# Patient Record
Sex: Male | Born: 1965
Health system: Southern US, Community
[De-identification: ages and names within clinical notes are randomized; demographics above are authoritative.]

## PROBLEM LIST (undated history)

## (undated) DIAGNOSIS — J189 Pneumonia, unspecified organism: Secondary | ICD-10-CM

## (undated) DIAGNOSIS — L309 Dermatitis, unspecified: Secondary | ICD-10-CM

## (undated) DIAGNOSIS — N529 Male erectile dysfunction, unspecified: Secondary | ICD-10-CM

## (undated) HISTORY — DX: Pneumonia, unspecified organism: J18.9

## (undated) HISTORY — PX: KNEE SURGERY: SHX244

## (undated) HISTORY — DX: Dermatitis, unspecified: L30.9

## (undated) HISTORY — PX: TONSILLECTOMY: SUR1361

---
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
## 1999-06-26 ENCOUNTER — Emergency Department (HOSPITAL_COMMUNITY): Admission: EM | Admit: 1999-06-26 | Discharge: 1999-06-26 | Payer: Self-pay

## 2003-04-05 ENCOUNTER — Inpatient Hospital Stay (HOSPITAL_COMMUNITY): Admission: EM | Admit: 2003-04-05 | Discharge: 2003-04-07 | Payer: Self-pay | Admitting: Emergency Medicine

## 2004-10-28 ENCOUNTER — Emergency Department (HOSPITAL_COMMUNITY): Admission: EM | Admit: 2004-10-28 | Discharge: 2004-10-28 | Payer: Self-pay | Admitting: Emergency Medicine

## 2006-01-28 DIAGNOSIS — J189 Pneumonia, unspecified organism: Secondary | ICD-10-CM

## 2006-01-28 HISTORY — DX: Pneumonia, unspecified organism: J18.9

## 2007-12-22 ENCOUNTER — Emergency Department (HOSPITAL_COMMUNITY): Admission: EM | Admit: 2007-12-22 | Discharge: 2007-12-22 | Payer: Self-pay | Admitting: Emergency Medicine

## 2007-12-23 ENCOUNTER — Inpatient Hospital Stay (HOSPITAL_COMMUNITY): Admission: EM | Admit: 2007-12-23 | Discharge: 2007-12-26 | Payer: Self-pay | Admitting: Emergency Medicine

## 2009-01-17 ENCOUNTER — Emergency Department (HOSPITAL_COMMUNITY): Admission: EM | Admit: 2009-01-17 | Discharge: 2009-01-17 | Payer: Self-pay | Admitting: Family Medicine

## 2009-02-01 ENCOUNTER — Emergency Department (HOSPITAL_COMMUNITY): Admission: EM | Admit: 2009-02-01 | Discharge: 2009-02-01 | Payer: Self-pay | Admitting: Family Medicine

## 2009-02-08 ENCOUNTER — Emergency Department (HOSPITAL_COMMUNITY): Admission: EM | Admit: 2009-02-08 | Discharge: 2009-02-08 | Payer: Self-pay | Admitting: Family Medicine

## 2010-04-15 LAB — POCT URINALYSIS DIP (DEVICE)
Bilirubin Urine: NEGATIVE
Glucose, UA: NEGATIVE mg/dL
Ketones, ur: NEGATIVE mg/dL
Nitrite: NEGATIVE
Protein, ur: NEGATIVE mg/dL
Specific Gravity, Urine: 1.02 (ref 1.005–1.030)
Urobilinogen, UA: 0.2 mg/dL (ref 0.0–1.0)
pH: 6 (ref 5.0–8.0)

## 2010-04-30 LAB — POCT URINALYSIS DIP (DEVICE)
Glucose, UA: NEGATIVE mg/dL
Hgb urine dipstick: NEGATIVE
Specific Gravity, Urine: 1.02 (ref 1.005–1.030)
Urobilinogen, UA: 0.2 mg/dL (ref 0.0–1.0)

## 2010-04-30 LAB — URINE CULTURE
Colony Count: NO GROWTH
Culture: NO GROWTH

## 2010-04-30 LAB — GC/CHLAMYDIA PROBE AMP, GENITAL: Chlamydia, DNA Probe: NEGATIVE

## 2010-06-12 NOTE — Op Note (Signed)
Vincent Cook, FESTA             ACCOUNT NO.:  [id number]   MEDICAL RECORD NO.:  [id number]          PATIENT TYPE:  INP   LOCATION:  5024                         FACILITY:  [facility]   PHYSICIAN:  Dionne Ano. Gramig, M.D.DATE OF BIRTH:  1965-03-13   DATE OF PROCEDURE:  12/23/2007  DATE OF DISCHARGE:                               OPERATIVE REPORT   I had the pleasure to see Vincent Cook today in the [facility]  Emergency Room at 3:00 a.m. on December 23, 2007.  This patient was seen  just over 24 hours ago at Urgent Care.  He was placed on antibiotics in  the form of doxycycline and sent home with conservatory measures after a  chief complaint of right index finger pain and swelling.  He continues  to have worsening pain and swelling, which prompted another ER visit  tonight.  He has negative x-rays.  His hand is swollen, painful, and he  appears to have a deep abscess.  I was called by the emergency room  staff to evaluate this.   The patient and I have discussed all issues.  He denies any usual  exposure.  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   PAST MEDICAL HISTORY:  None significant.   PAST SURGICAL HISTORY:  Knee arthroscopy by Dr. Brynda Greathouse.   MEDICINES:  Doxycycline recently.   ALLERGIES:  None.   SOCIAL HISTORY:  He is a single man who is employed by himself in an oil  changing business.   On exam, he is alert and oriented in no acute distress.  Vital signs are  stable.  He has intact sensation to the hand swelling dorsally over the  hand up to the wrist region and area of deep abscess over the index  finger.  There are no signs of flexor tenosynovitis at this juncture as  his flexor apparatus is soft and nontender.  The abscess is dorsally in  location between the PIP and MCP joints, it appears to track proximally.  The middle ring and small fingers are intact.  There are no signs of  dystrophy or infection.  I have reviewed this at  length and his  findings.  The chest is clear.  Heart is regular rate.  Abdomen is  nontender.  Lower extremity examination is benign.   IMPRESSION:  Deep abscess, right upper extremity about the hand and  index finger.   PLAN:  I have consented him for surgical endeavors, and he desires to  proceed understanding the risks and benefits of surgery.   PROCEDURE NOTE:   PREOPERATIVE DIAGNOSIS:  Deep abscess, right index finger of hand.   POSTOPERATIVE DIAGNOSIS:  Deep abscess, right index finger of hand.   PROCEDURE:  Incision and drainage, deep abscess right index finger of  hand.   SURGEON:  Dionne Ano. Amanda Pea, MD   INDICATIONS FOR THE PROCEDURE:  Please see above.   OPERATIVE PROCEDURE:  The patient was seen and underwent a wrist block  anesthetic with lidocaine without epinephrine given by myself.  He was  then prepped and draped  with Betadine scrub and paint in the usual  sterile fashion without difficulty.  Once he underwent sterile prep and  drape, I then performed an incision.  Dissection was carried down deeply  and immediately I encountered a large abscess.  This was cultured for  aerobic and anaerobic cultures.  I then proceeded to debride an area of  significant purulence and devitalized prenecrotic skin tissues as well  as fat necrosis.  He tolerated this well.  There were no complicating  features.  Once this done, I packed the wound after copious lavage with  irrigant.  The wound was packed with Iodoform.   He will be admitted for IV antibiotics including Fortaz and vancomycin,  await cultures.  Continue aggressive wound care program with serial  packing and I and D.  I have consulted therapy to see him for daily week  dressings to the finger, and we will monitor his condition closely.  I  will consider a repeat I and D if necessary and he understands.  Fair  prognosis at this juncture.   It was pleasure to see him today and all questions have been encouraged   and answered.      Dionne Ano. Amanda Pea, M.D.  Electronically Signed     WMG/MEDQ  D:  12/23/2007  T:  12/23/2007  Job:  [id number]

## 2010-06-15 NOTE — H&P (Signed)
NAME:  Vincent Cook, Vincent Cook NO.:  [id number]   MEDICAL RECORD NO.:  [id number]                   PATIENT TYPE:  EMS   LOCATION:  ED                                   FACILITY:  [facility]   PHYSICIAN:  Isla Pence, M.D.             DATE OF BIRTH:  26-Dec-1965   DATE OF ADMISSION:  04/04/2003  DATE OF DISCHARGE:                                HISTORY & PHYSICAL   ID STATEMENT:  This is a 45 year old black gentleman with no prior medical  history who has no primary care physician.   CHIEF COMPLAINT:  Throwing up since Friday.   HISTORY OF PRESENT ILLNESS:  This patient states he has been throwing up  over the weekend.  He says he has been throwing up blood, getting some acid  reflux, and then he started getting some shortness of breath on April 04, 2003, which was the day he came to the ER in the evening.  He apparently has  been experiencing some fever which started apparently on Friday.  There was  no actual temperature taken, but he just felt fevered.  Apparently he has  also had some coughing spells with hiccups, and he has been coughing up some  yellow phlegm.  He has been drinking water to help with his reflux type of  symptoms.  He denies previous history of reflux.  The patient notes that the  coughing spells are the ones initiating these gagging, throwing-up spells.  The patient has been also experiencing some diarrhea around the same time,  which he notes has been watery.  He has been going about 10 times a day,  slowed down a little bit.  He is taking Pepto-Bismol for this.  His last BM  was about 3 hours ago, but it is still watery.  No one else in the family is  sick.  He apparently had gone to some kind of going-away party for a friend  of his with a few others.  They had all eaten the same cake.  No one else is  sick.  The patient has had decreased oral intake secondary to his current  illness and says that he has lost about 16 pounds.  In  addition to the Pepto-  Bismol for his diarrhea he has been taking Alka-Seltzer, also Tylenol Sinus  __________ and Zantac.  All of these have helped some.  The patient denies  any sore throat or rhinorrhea.   ALLERGIES:  No known drug allergies.   CURRENT MEDICATIONS:  Just as per HPI.  Otherwise, he is not on any chronic  medications.   PAST MEDICAL HISTORY:  Denies any history of asthma, diabetes mellitus,  hypertension, coronary artery disease, etc.   PAST SURGICAL HISTORY:  1. He has had left knee surgery consisting of an arthroscopy.  2. He had tonsillectomy in 1984.   SOCIAL HISTORY:  He has never married or  divorced.  He lives with his  current partner, who is the mother of at least 1 of his children.  He has 4  kids.  Not all of the kids are with the same partner.  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  He is sexually  active with his current partner.  Denies any HIV risk factors.  Denies any  tobacco use.  Alcohol use he states maybe 3-4 times a week, variable amount,  but says he drinks a 6-pack each time during the football season.  On  further questioning, he is a little vague; says he does not drink much.   FAMILY HISTORY:  Denies coronary artery disease, diabetes mellitus, stroke,  cancers of any kind, hypertension.  Essentially denies all diseases.  Says  there are 5 generations of his family still alive.   REVIEW OF SYSTEMS:  As per HPI, but he says he does have a history of  snoring, and according to him the girlfriend has told him that he has had  apneic spells.  The rest is as per HPI.  He denies any bright red blood per  rectum or melena.   PHYSICAL EXAM:  VITAL SIGNS:  Temperature in the ER was 103, respiratory  rate 18, blood pressure 112/73, room air saturations were 99%.  GENERAL:  In no apparent distress.  HEENT:  The oral mucosa is currently moist.  He had received about 1 L of  normal saline in the ER.  He  had also received Phenergan, Zofran, potassium  orally and by IV.  He had also received 1 g of Rocephin and Zithromax 500 in  the ER.  He was also given Tylenol rectally for his fever.  LUNGS:  He has decreased breath sounds at the left base.  There is no  egophony.  HEART:  Regular rate and rhythm.  ABDOMEN:  Bowel sounds are normal, soft, nontender, not distended.  LOWER EXTREMITIES:  There is no edema.  NEUROLOGIC:  There are no focal deficits.   LABORATORY WORKUP:  Initial white count:  CBC showed a white count of 11.8  thousand, H&H of 14.7 and 42.3, platelet count of 190,000.  Differential  showed 86% PMNs, lymphocytes of 8.  BMP showed a sodium of 131, potassium of  2.9, chloride and CO2 were 95 and 29 respectively, BUN and creatinine 12 and  1.4, glucose of 141.  LFTs were normal except for a slight elevation in  total bilirubin, albumin of 3.3 which is minimally decreased, calcium was  normal.  His lipase was slightly elevated at 98.  PT was normal, PTT,  however, was slightly elevated at 43.  His UA showed a protein of greater  than 300, small leukocytes, macros were 0-2, small blood, micros were  negative.   Chest x-ray:  Clearly you can see a left-sided infiltrative-type of process.  The radiologist apparently had other concerns with possible hemorrhage,  possible mass.  Therefore, a CT scan was done which came out showing  consolidation.  This just needs to be followed up.   ASSESSMENT AND PLAN:  1. Left lobar pneumonia.  Will continue him on Rocephin and Zithromax for     community-acquired pneumonia.  He had blood cultures drawn in the ER     which need to be followed up.  Will do albuterol nebulizers q.i.d. to     help loosen up the secretions, Humibid L.A. also, and need to follow up  on his official CT scan report and see what else is mentioned on the CT     to make sure we did not need to do any anything further. 2. Decrease in potassium secondary to decreased  oral intake and diarrhea.     Will treat this.  3. Decrease in sodium secondary once again to the decreased oral intake with     dehydration with his diarrhea.  Will go ahead and continue him on IV     fluids.  4. Diarrhea.  Will do supportive treatment with Imodium A-D, and I am     suspecting lipase is slightly elevated secondary to this and him having     some nausea and vomiting, so this just needs to be repeated, which I have     ordered already.  5. Question of increase in his PTT.  This may be just transient due to his     current illness; therefore, will recheck it in the a.m. and see.  6. Question of sleep disorder.  This needs to be further worked up.  I do     not think it is a good thing to work him up currently with his     significant pneumonia but as an outpatient and, thus, I have advised him     that considering his age it probably is a good idea for him to get a     primary care physician and once the pneumonia settles to evaluate the     possibility of possible sleep disorder.  7. For deep vein thrombosis prophylaxis I have gone ahead and put him on     Lovenox since I am not very sure he will be getting out of bed as much     with him feeling not too good.  8. With some of his marked gastrointestinal irritation, dyspepsia kind of     symptoms, will give him Protonix.                                               Isla Pence, M.D.    RRV/MEDQ  D:  04/05/2003  T:  04/05/2003  Job:  [id number]

## 2010-06-15 NOTE — Discharge Summary (Signed)
Vincent Cook, Vincent Cook                         ACCOUNT NO.:  [id number]   MEDICAL RECORD NO.:  [id number]                   PATIENT TYPE:  INP   LOCATION:  0450                                 FACILITY:  [facility]   PHYSICIAN:  Jackie Plum, M.D.             DATE OF BIRTH:  [birth year]   DATE OF ADMISSION:  04/04/2003  DATE OF DISCHARGE:                                 DISCHARGE SUMMARY   DISCHARGE DIAGNOSES:  1. Pneumonia.  2. Hypokalemia, resolved.  3. Diarrhea, resolved.  4. Questionable sleep disorder/sleep apnea; outpatient follow-up     recommended.  5. Mild normocytic anemia; outpatient follow-up recommended.  6. Reflux esophagitis.   DISCHARGE MEDICATIONS:  1. Protonix 40 mg daily.  2. Humibid LA 600 mg b.i.d.  3. Ceftin 500 mg b.i.d. for 10 days.  4. Zithromax 250 mg daily for 2 more days.  5. [employer] 240 mL b.i.d.  6. Ambien 5 mg p.o. q.h.s. p.r.n.   ACTIVITY:  As tolerated.   DIET:  Diet to be no restrictions.   The patient to report to M.D. if he experiences any problems including but  not limited to fever, chills, shortness of breath.  He is definitely to see  a doctor for repeat CT scan of the chest.  This has been emphasized to the  patient.  Follow up will be with [name] where the patient is  to be seen by a [name] in 10-14 days.   DISCHARGE LABORATORY DATA:  Wbc count 8.3, hemoglobin 12.1, hematocrit 35.6,  MCV 91.7, platelet count 224.  Sodium 135, potassium 3.9, chloride 111, CO2  22, glucose 176, BUN 9, creatinine 1.0, calcium 7.6, albumin 2.6.   CONSULTS:  Not applicable.   PROCEDURES:  Not applicable.   CONDITION:  Discharged improved and satisfactory.   REASON FOR HOSPITALIZATION:  Community-acquired pneumonia.   Please see the admission H&P by Dr. Frederico Hamman dated April 04, 2003.  The  patient presented with fever, cough productive of yellowish sputum,  heartburn, diarrhea, and some questionable  hematemesis.  No history of  hemoptysis.  According to the admission H&P by Dr. Baldo Daub the patient's  temperature was 103 degrees Fahrenheit.  His lung exam was notable for  decreased breath sounds at the left base.  Cardiac exam was unremarkable.  Abdomen was soft, nontender.  He was alert and oriented x3.  Lab work  notable for leukocytosis of 11,800 with hemoglobin of 14.7; hematocrit of  32.3; MCV of 90.2; platelet count of 190.  He had a hyponatremia of 131 with  hypokalemia of 2.9.  His liver function tests were unremarkable.  He had an  elevated, mildly, lipase which was thought to be due to his GI symptoms  without any overt acute pancreatitis clinically.  The patient had a CT done  of chest which showed dense consolidation of the lingula and minimal patchy  density within the left lower  lobe.  He was therefore admitted for  pneumonia, community acquired.   HOSPITAL COURSE:  The patient was admitted to the hospitalist service  whereupon he was started on IV Rocephin and Zithromax.  Blood cultures were  drawn but did not reveal any growth at time of discharge.  Pulmonary  toileting measures including nebulizer and expectorants were also  instituted.  Other supplementary supportive measures including IV fluids  with antiemetics were also instituted.  His potassium was appropriately  repleted with correction of his sodium.  His diarrhea was also treated with  some Imodium, which had resolved at the time of discharge.  The patient was  subsequently put on DVT prophylaxis and later on discontinued because he had  been feeling well and had started to be ambulatory.  He received Protonix  for his GI symptoms.  It is believed that his history of hematemesis which  is described as a few streaks of blood in his vomitus is related to his  reflex esophagitis.  There was no significant acute blood loss while the  patient was in the hospital.  He is going to be continued on PPI at this  time.   This morning on rounds the patient is feeling well, does not have any  fever or chills, denies any persistent cough, denies any shortness of  breath, has been ambulatory on the floor without any problems.  His O2  saturation on room air at 10 o'clock was 98%.  His vital signs are stable,  his blood pressure was 108/62, pulse of 79, respiratory rate of 18,  temperature of 97.9 degrees Fahrenheit.  He is not clinically dehydrated on  oropharynx exam.He has mild conjunctival pallor.  Lung exam is notable for  mildly-decreased vesicular breath sounds at his left base without any  obvious wheezes; in fact, his breath sounds are improved.  Cardiac exam  notable for regular rate and rhythm without any gallops.  Bowel sounds are  soft and does not have an tenderness on abdominal exam.  Extremity exam is  without any edema.  He is alert and oriented x3, no acute focal deficits.  The patient is therefore deemed appropriate for discharge with outpatient  follow-up.  He will need a repeat CAT scan in about 2 months for follow-up  of his dense consolidation.   The patient does not have a primary care physician and I spent more than 30  minutes preparing him for discharge today.  I really counseled him regarding  need for follow-up with a [name] to review repeat imaging study of his  chest with respect to abnormality on CT scan which is deemed presumptively  due to pneumonia for clearance.  He expressed understanding, his questions  were appropriately answered, and he is satisfied and understands  satisfactorily.                                               Jackie Plum, M.D.    GO/MEDQ  D:  04/07/2003  T:  04/07/2003  Job:  [id number]   cc:   [name]

## 2010-06-15 NOTE — Discharge Summary (Signed)
NAMEBUEL, MOLDER             ACCOUNT NO.:  [id number]   MEDICAL RECORD NO.:  [id number]          PATIENT TYPE:  INP   LOCATION:  5024                         FACILITY:  [facility]   PHYSICIAN:  Dionne Ano. Gramig, M.D.DATE OF BIRTH:  27-Aug-1965   DATE OF ADMISSION:  12/23/2007  DATE OF DISCHARGE:  12/26/2007                               DISCHARGE SUMMARY   Vincent Cook was admitted at 4 a.m. on December 23, 2007, after he  presented with a deep abscess.  He underwent I and D of the deep  abscess.  The patient was noted to have culture-positive MRSA.  He was  placed initially on vancomycin and Zosyn.  I discontinued the Zosyn as  his culture grew out MRSA.  The patient continued in the hospital from  his admission date, December 23, 2007, through December 26, 2007.  During that time, he received multiple care/hydrotherapy, aggressive  wound debridement and changes, and ultimately did quite well.  At the  time of his date of discharge, December 26, 2007, the patient had stable  vitals.  His heart was regular rate.  The lungs were clear.  Abdomen was  nontender.  He was tolerating a regular diet and his wound was much  improved.  We will continue outpatient measures and serial debridements  and wound care at my office.  He was stable for discharge on December 26, 2007, and was discharged on doxycycline 100 mg b.i.d., Bactrim 1-2  q.4-6 h. p.r.n. pain, p.o. vitamin C 1000 mg a day, and Peri-Colace 1  p.o. b.i.d.  He will return to the office to see me in 24-48 hours,  notify me should any problems and we will continue to keep a very close  eye on this gentleman.  It has been a pleasure seeing and treating him,  and hope that we will eradicate his infection swiftly.  He understands  the implication of community-acquired MRSA infections etc., as explained  to him.   FINAL DIAGNOSIS:  Deep abscess with culture-positive methicillin-  resistant Staphylococcus aureus.      Dionne Ano.  Amanda Pea, M.D.  Electronically Signed     WMG/MEDQ  D:  03/10/2008  T:  03/10/2008  Job:  [id number]

## 2010-10-05 ENCOUNTER — Inpatient Hospital Stay (INDEPENDENT_AMBULATORY_CARE_PROVIDER_SITE_OTHER)
Admission: RE | Admit: 2010-10-05 | Discharge: 2010-10-05 | Disposition: A | Discharge status: Home / Self Care | Payer: Self-pay | Source: Ambulatory Visit | Attending: Emergency Medicine | Admitting: Emergency Medicine

## 2010-10-05 DIAGNOSIS — L0213 Carbuncle of neck: Secondary | ICD-10-CM

## 2010-10-30 LAB — WOUND CULTURE

## 2010-10-30 LAB — DIFFERENTIAL
Eosinophils Absolute: 0.2
Eosinophils Relative: 1
Lymphocytes Relative: 10 — ABNORMAL LOW
Lymphs Abs: 1.3
Monocytes Absolute: 0.7
Monocytes Relative: 5

## 2010-10-30 LAB — CULTURE, ROUTINE-ABSCESS

## 2010-10-30 LAB — BASIC METABOLIC PANEL
BUN: 9
Chloride: 101
GFR calc Af Amer: >60
Potassium: 3.9
Sodium: 132 — ABNORMAL LOW

## 2010-10-30 LAB — CBC
HCT: 46.2
Hemoglobin: 15.7
MCV: 96.4
RBC: 4.8
WBC: 12.9 — ABNORMAL HIGH

## 2010-10-30 LAB — ANAEROBIC CULTURE

## 2012-08-18 ENCOUNTER — Encounter (HOSPITAL_COMMUNITY): Payer: Self-pay | Admitting: Emergency Medicine

## 2012-08-18 ENCOUNTER — Emergency Department (INDEPENDENT_AMBULATORY_CARE_PROVIDER_SITE_OTHER)
Admission: EM | Admit: 2012-08-18 | Discharge: 2012-08-18 | Disposition: A | Discharge status: Home / Self Care | Payer: Self-pay | Source: Home / Self Care | Attending: Family Medicine | Admitting: Family Medicine

## 2012-08-18 DIAGNOSIS — L02219 Cutaneous abscess of trunk, unspecified: Secondary | ICD-10-CM

## 2012-08-18 DIAGNOSIS — L02213 Cutaneous abscess of chest wall: Secondary | ICD-10-CM

## 2012-08-18 MED ORDER — DOXYCYCLINE HYCLATE 100 MG PO CAPS: 100.0000 mg | ORAL_CAPSULE | Freq: Two times a day (BID) | ORAL | Status: DC

## 2012-08-18 NOTE — ED Notes (Signed)
Triaged/discharged patient for [employer], rn

## 2012-08-18 NOTE — Discharge Instructions (Signed)
Warm compress twice a day when you take the antibiotic, take all of medicine, return as needed. °

## 2012-08-18 NOTE — ED Provider Notes (Signed)
   History    CSN: [id number] Arrival date & time 08/18/12  1651  First MD Initiated Contact with Patient 08/18/12 1719     No chief complaint on file.  (Consider location/radiation/quality/duration/timing/severity/associated sxs/prior Treatment) Patient is a 47 y.o. male presenting with abscess. The history is provided by the patient.  Abscess Location:  Torso Torso abscess location:  R chest Abscess quality: fluctuance, painful and redness   Red streaking: no   Duration:  5 days Progression:  Worsening Pain details:    Severity:  Mild Risk factors: hx of MRSA    No past medical history on file. No past surgical history on file. No family history on file. History  Substance Use Topics  . Smoking status: Not on file  . Smokeless tobacco: Not on file  . Alcohol Use: Not on file    Review of Systems  Constitutional: Negative.   Skin: Positive for rash.    Allergies  Review of patient's allergies indicates not on file.  Home Medications   Current Outpatient Rx  Name  Route  Sig  Dispense  Refill  . doxycycline (VIBRAMYCIN) 100 MG capsule   Oral   Take 1 capsule (100 mg total) by mouth 2 (two) times daily.   20 capsule   0    BP 127/87  Pulse 68  Temp(Src) 98.3 F (36.8 C) (Oral)  Resp 16  SpO2 100% Physical Exam  Nursing note and vitals reviewed. Constitutional: He is oriented to person, place, and time. He appears well-developed and well-nourished. No distress.  Neurological: He is alert and oriented to person, place, and time.  Skin: Skin is warm and dry. There is erythema.  2cm fluctuant lesion on right upper chest, tender    ED Course  INCISION AND DRAINAGE Date/Time: 08/18/2012 5:33 PM Performed by: Linna Hoff Authorized by: Bradd Canary D Consent: Verbal consent obtained. Risks and benefits: risks, benefits and alternatives were discussed Consent given by: patient Type: abscess Body area: trunk Location details: chest Local anesthetic:  topical anesthetic Patient sedated: no Scalpel size: 11 Incision type: single straight Complexity: simple Drainage: purulent Drainage amount: moderate Wound treatment: wound left open Patient tolerance: Patient tolerated the procedure well with no immediate complications. Comments: Culture obtained   (including critical care time) Labs Reviewed - No data to display No results found. 1. Chest wall abscess     MDM    Linna Hoff, MD 08/18/12 1736

## 2012-08-18 NOTE — ED Notes (Signed)
Reports bite to right side of chest.  Reports this has been present for 5 days.

## 2012-08-19 ENCOUNTER — Telehealth (HOSPITAL_COMMUNITY): Payer: Self-pay | Admitting: *Deleted

## 2012-08-19 NOTE — ED Notes (Signed)
Pt. called for a cheaper Rx. States Doxycycline is $53.00.  Discussed with Dr. Artis Flock. and he said to change it to Septra DS 1 po BID x 10 days.  I told pt. I would call it now.  Rx. called to pharmacist @ [facility] at [facility] @ [phone].

## 2012-08-22 LAB — CULTURE, ROUTINE-ABSCESS

## 2012-08-24 NOTE — ED Notes (Addendum)
Abscess culture: Rare staph species (coagulase neg.).  Pt. adequately treated with I and D and Septra DS.   Vassie Moselle 08/24/2012

## 2012-08-26 NOTE — ED Notes (Signed)
Returned call from pt about his medication for a new abscess.  Per Dr Ladon Applebaum, if pt noticed another abscess he needs to be seen again.  Message left on asn mach

## 2013-05-31 ENCOUNTER — Encounter (HOSPITAL_COMMUNITY): Payer: Self-pay | Admitting: Emergency Medicine

## 2013-05-31 ENCOUNTER — Emergency Department (HOSPITAL_COMMUNITY)
Admission: EM | Admit: 2013-05-31 | Discharge: 2013-05-31 | Disposition: A | Discharge status: Home / Self Care | Payer: BC Managed Care – PPO | Attending: Emergency Medicine | Admitting: Emergency Medicine

## 2013-05-31 DIAGNOSIS — G8929 Other chronic pain: Secondary | ICD-10-CM | POA: Insufficient documentation

## 2013-05-31 DIAGNOSIS — M25562 Pain in left knee: Secondary | ICD-10-CM

## 2013-05-31 DIAGNOSIS — M25569 Pain in unspecified knee: Secondary | ICD-10-CM | POA: Insufficient documentation

## 2013-05-31 DIAGNOSIS — Z792 Long term (current) use of antibiotics: Secondary | ICD-10-CM | POA: Insufficient documentation

## 2013-05-31 DIAGNOSIS — H579 Unspecified disorder of eye and adnexa: Secondary | ICD-10-CM | POA: Insufficient documentation

## 2013-05-31 DIAGNOSIS — M25469 Effusion, unspecified knee: Secondary | ICD-10-CM | POA: Insufficient documentation

## 2013-05-31 DIAGNOSIS — Z9889 Other specified postprocedural states: Secondary | ICD-10-CM | POA: Insufficient documentation

## 2013-05-31 DIAGNOSIS — H5789 Other specified disorders of eye and adnexa: Secondary | ICD-10-CM

## 2013-05-31 MED ORDER — HYDROCODONE-ACETAMINOPHEN 5-325 MG PO TABS: 1.0000 | ORAL_TABLET | ORAL | Status: DC | PRN

## 2013-05-31 MED ORDER — KETOTIFEN FUMARATE 0.025 % OP SOLN: 1.0000 [drp] | Freq: Two times a day (BID) | OPHTHALMIC | Status: DC

## 2013-05-31 MED ORDER — MELOXICAM 7.5 MG PO TABS: 7.5000 mg | ORAL_TABLET | Freq: Every day | ORAL | Status: DC

## 2013-05-31 NOTE — ED Notes (Signed)
Pt reports knee pain and swelling, hx of surgery to knees, denies injury.

## 2013-05-31 NOTE — ED Notes (Signed)
PA at bedside.

## 2013-05-31 NOTE — ED Provider Notes (Signed)
Medical screening examination/treatment/procedure(s) were performed by non-physician practitioner and as supervising physician I was immediately available for consultation/collaboration.   EKG Interpretation None        Wilkin Lippy Y. Chayce Robbins, MD 05/31/13 1619 

## 2013-05-31 NOTE — Discharge Instructions (Signed)
Read the information below.  Use the prescribed medication as directed.  Please discuss all new medications with your pharmacist.  Do not take additional tylenol while taking the prescribed pain medication to avoid overdose.  You may return to the Emergency Department at any time for worsening condition or any new symptoms that concern you.  If you develop uncontrolled pain, weakness or numbness of the extremity, severe discoloration of the skin, or you are unable to walk, return to the ER for a recheck.   If you develop eye pain, increased discharge from your eye, change in vision, return to the ER immediately for a recheck.    Knee Pain Knee pain can be a result of an injury or other medical conditions. Treatment will depend on the cause of your pain. HOME CARE  Only take medicine as told by your doctor.  Keep a healthy weight. Being overweight can make the knee hurt more.  Stretch before exercising or playing sports.  If there is constant knee pain, change the way you exercise. Ask your doctor for advice.  Make sure shoes fit well. Choose the right shoe for the sport or activity.  Protect your knees. Wear kneepads if needed.  Rest when you are tired. GET HELP RIGHT AWAY IF:   Your knee pain does not stop.  Your knee pain does not get better.  Your knee joint feels hot to the touch.  You have a fever. MAKE SURE YOU:   Understand these instructions.  Will watch this condition.  Will get help right away if you are not doing well or get worse. Document Released: 04/12/2008 Document Revised: 04/08/2011 Document Reviewed: 04/12/2008 [facility] Patient Information 2014 [place], [place].

## 2013-05-31 NOTE — ED Provider Notes (Signed)
CSN: [id number]     Arrival date & time 05/31/13  1123 History  This chart was scribed for non-physician practitioner working with No att. providers found, by Jarvis Morganaylor Ferguson, ED Scribe. This patient was seen in room TR04C/TR04C and the patient's care was started at 3:45PM.    Chief Complaint  Patient presents with  . Joint Swelling  . Knee Pain    The history is provided by the patient. No language interpreter was used.   HPI Comments: Vincent Cook is a 48 y.o. male who presents to the Emergency Department complaining of gradually worsening, aching, 6/10, left knee pain onset 3 days ago. Patient notes that the pain got worse yesterday. Patient denies any injury. Patient has a history of arthroscopic surgery of his left knee due to a torn ligament many years ago. Patient reports that his knee pain will flare up about 2x a year. Patient states that he has associated swelling. He reports that he has mild relief with rest. Patient states that he has not taken anything for the pain. States he walked more than usual this week and helped someone pick up a tv, but denies injury related to this.  Patient denies any fever, recent illness, weakness or numbness in leg. No injury.    Patient has a secondary complaint of a yellow spot in his left eye onset two weeks ago. Patient also states that he has associated clear drainage and mild redness. Patient states that he has environmental allergies. Patient denies any excessive sun exposure.  Denies exposure to sick contacts.  No recent illness.  No FB or FB sensation.  No trauma to eye.      History reviewed. No pertinent past medical history. Past Surgical History  Procedure Laterality Date  . Knee surgery     History reviewed. No pertinent family history. History  Substance Use Topics  . Smoking status: Never Smoker   . Smokeless tobacco: Not on file  . Alcohol Use: Yes    Review of Systems  Constitutional: Negative for fever.  Eyes: Positive  for discharge ("watery") and redness.  Musculoskeletal: Positive for joint swelling (left knee pain).  Neurological: Negative for weakness and numbness.  All other systems reviewed and are negative.     Allergies  Review of patient's allergies indicates no known allergies.  Home Medications   Prior to Admission medications   Medication Sig Start Date End Date Taking? Authorizing Provider  doxycycline (VIBRAMYCIN) 100 MG capsule Take 1 capsule (100 mg total) by mouth 2 (two) times daily. 08/18/12   Linna HoffJames D Kindl, MD   Triage Vitals: BP 124/72  Pulse 66  Temp(Src) 97.6 F (36.4 C) (Oral)  Resp 19  Ht 5\' 11"  (1.803 m)  Wt 196 lb 9 oz (89.16 kg)  BMI 27.43 kg/m2  SpO2 99%  Physical Exam  Nursing note and vitals reviewed. Constitutional: He appears well-developed and well-nourished. No distress.  HENT:  Head: Normocephalic and atraumatic.  Eyes: EOM and lids are normal. Pupils are equal, round, and reactive to light. Right eye exhibits no discharge. Left eye exhibits no discharge. No scleral icterus.    Very mild injection of left eye compared to right.  No periorbital or lid edema, erythema, warmth, or tenderness.    Neck: Neck supple.  Pulmonary/Chest: Effort normal.  Musculoskeletal:       Right knee: Normal.       Left knee: He exhibits normal range of motion, no ecchymosis, no deformity, no erythema, normal alignment, no  LCL laxity, no bony tenderness and no MCL laxity. No tenderness found.       Legs: Gait is normal.  Negative thessaly test.   Calves nontender, no edema, erythema, or warmth.  Distal pulses intact.    Neurological: He is alert.  Skin: He is not diaphoretic.    ED Course  Procedures (including critical care time)  DIAGNOSTIC STUDIES: Oxygen Saturation is 99% on RA, normal by my interpretation.    COORDINATION OF CARE: 3:53 PM- Will discharge patient with medication. Also advised patient to consult with an orthopedic specialist.    DIAGNOSTIC  STUDIES: Oxygen Saturation is 99% on RA, normal by my interpretation.    COORDINATION OF CARE:    Labs Review Labs Reviewed - No data to display  Imaging Review No results found.   EKG Interpretation None      MDM   Final diagnoses:  Left knee pain  Irritation of left eye    Pt with flare up of chronic left knee pain without injury.  No s/s concerning for septic joint.  No injury.  Pt bears weight.  Pt placed in knee sleeve, d/c home with pain medication, ortho follow up.  Pt has had two weeks of left eye symptoms without injury or FB.  Watery discharge only.  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  Only mild injection.  Denies possibility of FB.  Does not have purulent discharge.  No URI symptoms.  Likely mild allergic conjunctivitis.  D/C home with zaditor eyedrops and ophthlamology referral, also norco, mobic with orthopedic referral.  Discussed findings, treatment, and follow up  with patient.  Pt given return precautions.  Pt verbalizes understanding and agrees with plan.      I personally performed the services described in this documentation, which was scribed in my presence. The recorded information has been reviewed and is accurate.     Trixie Dredgemily Ailie Gage, PA-C 05/31/13 1616

## 2013-07-01 ENCOUNTER — Encounter: Payer: Self-pay | Admitting: Medical

## 2013-07-01 ENCOUNTER — Ambulatory Visit (INDEPENDENT_AMBULATORY_CARE_PROVIDER_SITE_OTHER): Payer: BC Managed Care – PPO | Admitting: Medical

## 2013-07-01 VITALS — BP 100/70 | HR 80 | Temp 98.0°F | Resp 14 | Ht 71.0 in | Wt 198.0 lb

## 2013-07-01 DIAGNOSIS — R358 Other polyuria: Secondary | ICD-10-CM

## 2013-07-01 DIAGNOSIS — F17200 Nicotine dependence, unspecified, uncomplicated: Secondary | ICD-10-CM

## 2013-07-01 DIAGNOSIS — H538 Other visual disturbances: Secondary | ICD-10-CM

## 2013-07-01 DIAGNOSIS — Z23 Encounter for immunization: Secondary | ICD-10-CM

## 2013-07-01 DIAGNOSIS — R3589 Other polyuria: Secondary | ICD-10-CM

## 2013-07-01 DIAGNOSIS — R37 Sexual dysfunction, unspecified: Secondary | ICD-10-CM

## 2013-07-01 DIAGNOSIS — F529 Unspecified sexual dysfunction not due to a substance or known physiological condition: Secondary | ICD-10-CM

## 2013-07-01 DIAGNOSIS — Z125 Encounter for screening for malignant neoplasm of prostate: Secondary | ICD-10-CM

## 2013-07-01 DIAGNOSIS — R6882 Decreased libido: Secondary | ICD-10-CM

## 2013-07-01 DIAGNOSIS — Z0000 Encounter for general adult medical examination without abnormal findings: Secondary | ICD-10-CM

## 2013-07-01 DIAGNOSIS — Z113 Encounter for screening for infections with a predominantly sexual mode of transmission: Secondary | ICD-10-CM

## 2013-07-01 LAB — POCT URINALYSIS DIPSTICK
BILIRUBIN UA: NEGATIVE
Blood, UA: NEGATIVE
Glucose, UA: NEGATIVE
KETONES UA: NEGATIVE
LEUKOCYTES UA: NEGATIVE
Nitrite, UA: NEGATIVE
PH UA: 5
Spec Grav, UA: 1.015
Urobilinogen, UA: NEGATIVE

## 2013-07-01 LAB — CBC WITH DIFFERENTIAL/PLATELET
Basophils Absolute: 0.1 10*3/uL (ref 0.0–0.1)
Basophils Relative: 1 % (ref 0–1)
Eosinophils Absolute: 0.2 10*3/uL (ref 0.0–0.7)
Eosinophils Relative: 4 % (ref 0–5)
HEMATOCRIT: 46.3 % (ref 39.0–52.0)
HEMOGLOBIN: 15.7 g/dL (ref 13.0–17.0)
LYMPHS PCT: 34 % (ref 12–46)
Lymphs Abs: 1.8 10*3/uL (ref 0.7–4.0)
MCH: 31.7 pg (ref 26.0–34.0)
MCHC: 33.9 g/dL (ref 30.0–36.0)
MCV: 93.5 fL (ref 78.0–100.0)
MONO ABS: 0.4 10*3/uL (ref 0.1–1.0)
MONOS PCT: 8 % (ref 3–12)
NEUTROS ABS: 2.8 10*3/uL (ref 1.7–7.7)
Neutrophils Relative %: 53 % (ref 43–77)
Platelets: 207 10*3/uL (ref 150–400)
RBC: 4.95 MIL/uL (ref 4.22–5.81)
RDW: 12.6 % (ref 11.5–15.5)
WBC: 5.2 10*3/uL (ref 4.0–10.5)

## 2013-07-01 LAB — TSH: TSH: 1.203 u[IU]/mL (ref 0.350–4.500)

## 2013-07-01 LAB — COMPREHENSIVE METABOLIC PANEL
ALT: 22 U/L (ref 0–53)
AST: 17 U/L (ref 0–37)
Albumin: 3.9 g/dL (ref 3.5–5.2)
Alkaline Phosphatase: 45 U/L (ref 39–117)
BUN: 13 mg/dL (ref 6–23)
CO2: 23 mEq/L (ref 19–32)
Calcium: 8.9 mg/dL (ref 8.4–10.5)
Chloride: 107 mEq/L (ref 96–112)
Creat: 0.99 mg/dL (ref 0.50–1.35)
Glucose, Bld: 102 mg/dL — ABNORMAL HIGH (ref 70–99)
Potassium: 4.1 mEq/L (ref 3.5–5.3)
Sodium: 137 mEq/L (ref 135–145)
Total Bilirubin: 0.7 mg/dL (ref 0.2–1.2)
Total Protein: 6.7 g/dL (ref 6.0–8.3)

## 2013-07-01 LAB — LIPID PANEL
Cholesterol: 156 mg/dL (ref 0–200)
HDL: 53 mg/dL (ref >39)
LDL Cholesterol: 77 mg/dL (ref 0–99)
Total CHOL/HDL Ratio: 2.9 Ratio
Triglycerides: 130 mg/dL (ref <150)
VLDL: 26 mg/dL (ref 0–40)

## 2013-07-01 NOTE — Patient Instructions (Addendum)
  Thank you for giving me the opportunity to serve you today.    Your diagnosis today includes: Encounter Diagnoses  Name Primary?  . Routine general medical examination at a health care facility Yes  . Sexual dysfunction   . Libido, decreased   . Blurred vision   . Polyuria   . Screen for STD (sexually transmitted disease)   . Screening for prostate cancer   . Need for Tdap vaccination   . Need for prophylactic vaccination against Streptococcus pneumoniae (pneumococcus)   . Tobacco use disorder      Specific recommendations today include:  I recommend you see and eye doctor and dentist yearly for routine care  I recommend you eat a healthy diet  I recommend you get exercise daily  I recommend flu shot in September each year  We updated your Tdap (tetanus, diptheria, and pertussis vaccine) today  We update your Pneumococcal vaccine 23 today  I recommend you consider stopping tobacco completely  We will call with lab results   Return pending labs.

## 2013-07-01 NOTE — Addendum Note (Signed)
Addended by: Janeice Robinson on: 07/01/2013 09:48 AM   Modules accepted: Orders

## 2013-07-01 NOTE — Progress Notes (Signed)
Subjective:   HPI  Vincent Cook is a 48 y.o. male who presents for a complete physical.  New patient today.  Accompanied by girlfriend.    Preventative care: Last ophthalmology visit:n/a Last dental visit:n/a Last colonoscopy:n/a Last prostate exam: ? Last EKG:n/a Last labs:new  Prior vaccinations: TD or Tdap:over 15 years ago Influenza:never Pneumococcal:n/a Shingles/Zostavax:n/a  Advanced directive:n/a Health care power of attorney:n/a Living will:n/a  Concerns: Several concerns that he discussed with girflreidn out of the room.    He reports problems with sex drive x 5-63 months.  In the past year has had problems with panic feeling, anxiety.  Started smoking due to this.  He wonders about low testosterone.  Erections are like they were 18 mo ago.   Seldom gets morning erections.  Been with girlfriend x 3 years.  Has trouble getting and keeping erections.    He wants prostate screening given uncle's hx/o prostate cancer.   Has blurred vision and increased urination, wants screening for diabets.  Wants STD screening.    He reports chronic pain with left knee, 2 x/ mo has really bad days.    Reviewed their medical, surgical, family, social, medication, and allergy history and updated chart as appropriate.  Past Medical History  Diagnosis Date  . Pneumonia 2008    [facility], 3 day hospitalization    Past Surgical History  Procedure Laterality Date  . Knee surgery      left, ligament repair, Dr. Telford Nab  . Tonsillectomy      History   Social History  . Marital Status: Single    Spouse Name: N/A    Number of Children: N/A  . Years of Education: N/A   Occupational History  . Not on file.   Social History Main Topics  . Smoking status: Current Every Day Smoker -- 0.50 packs/day for 1 years  . Smokeless tobacco: Not on file  . Alcohol Use: 7.2 oz/week    12 Cans of beer per week  . Drug Use: No  . Sexual Activity: Not on file   Other Topics  Concern  . 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 Diabetes Mother   . Diabetes Brother   . Cancer Maternal Uncle     prostate  . Heart disease Neg Hx   . Hypertension Neg Hx   . Stroke Neg Hx     No current outpatient prescriptions on file.  No Known Allergies     Review of Systems Constitutional: -fever, -chills, -sweats, -unexpected weight change, -decreased appetite, -fatigue Allergy: -sneezing, -itching, -congestion Dermatology: +changing moles, --rash, -lumps ENT: -runny nose, -ear pain, -sore throat, -hoarseness, -sinus pain, -teeth pain, - ringing in ears, -hearing loss, -nosebleeds Cardiology: -chest pain, -palpitations, -swelling, -difficulty breathing when lying flat, -waking up short of breath Respiratory: -cough, -shortness of breath, -difficulty breathing with exercise or exertion, -wheezing, -coughing up blood Gastroenterology: -abdominal pain, -nausea, -vomiting, -diarrhea, -constipation, -blood in stool, -changes in bowel movement, -difficulty swallowing or eating Hematology: -bleeding, -bruising  Musculoskeletal: -joint aches, -muscle aches, +joint swelling, -back pain, -neck pain, -cramping, -changes in gait Ophthalmology: denies vision changes, eye redness, itching, discharge Urology: -burning with urination, -difficulty urinating, -blood in urine, -urinary frequency, -urgency, -incontinence Neurology: -headache, -weakness, -tingling, -numbness, -memory loss, -falls, -dizziness Psychology: -depressed mood, -agitation, +sleep problems  Objective:   Physical Exam  BP 100/70  Pulse 80  Temp(Src) 98 F (36.7 C) (Oral)  Resp 14  Ht '5\' 11"'  (1.803 m)  Wt 198 lb (89.812 kg)  BMI 27.63 kg/m2  General appearance: alert, no distress, WD/WN, AA male Skin:few scatterd macules, no worrisome lesions HEENT:  normocephalic, conjunctiva/corneas normal, sclerae anicteric, PERRLA, EOMi, nares patent, no discharge or erythema, pharynx normal Oral cavity: MMM, tongue normal, teeth with moderate stain Neck: supple, no lymphadenopathy, no thyromegaly, no masses, normal ROM, no bruits Chest: non tender, normal shape and expansion Heart: RRR, normal S1, S2, no murmurs Lungs: CTA bilaterally, no wheezes, rhonchi, or rales Abdomen: +bs, soft, non tender, non distended, no masses, no hepatomegaly, no splenomegaly, no bruits Back: non tender, normal ROM, no scoliosis Musculoskeletal: upper extremities non tender, no obvious deformity, normal ROM throughout, lower extremities non tender, no obvious deformity, normal ROM throughout Extremities: no edema, no cyanosis, no clubbing Pulses: 2+ symmetric, upper and lower extremities, normal cap refill Neurological: alert, oriented x 3, CN2-12 intact, strength normal upper extremities and lower extremities, sensation normal throughout, DTRs 2+ throughout, no cerebellar signs, gait normal Psychiatric: normal affect, behavior normal, pleasant  GU: normal male external genitalia, circumcised, nontender, no masses, no hernia, no lymphadenopathy Rectal: anus normal tone, no hemorrhoids external, prostate WNL, no nodules, occult negative stool   Assessment and Plan :    Encounter Diagnoses  Name Primary?  . Routine general medical examination at a health care facility Yes  . Sexual dysfunction   . Libido, decreased   . Blurred vision   . Polyuria   . Screen for STD (sexually transmitted disease)   . Screening for prostate cancer      Physical exam - addressed his concerns, discussed healthy lifestyle, diet, exercise, preventative care, vaccinations, and addressed their concerns.    Counseled on the pneumococcal vaccine.  Vaccine information sheet given.  Pneumococcal vaccine given after consent obtained.  Counseled on the Tdap (tetanus, diptheria, and acellular  pertussis) vaccine.  Vaccine information sheet given. Tdap vaccine given after consent obtained.  Specific recommendations today include:  I recommend you see and eye doctor and dentist yearly for routine care  I recommend you eat a healthy diet  I recommend you get exercise daily  I recommend flu shot in September each year  We updated your Tdap (tetanus, diptheria, and pertussis vaccine) today  We update your Pneumococcal vaccine 23 today  I recommend you consider stopping tobacco completely  We will call with lab results  Follow-up pending labs

## 2013-07-02 ENCOUNTER — Other Ambulatory Visit: Payer: Self-pay | Admitting: Medical

## 2013-07-02 LAB — PSA: PSA: 0.93 ng/mL (ref <=4.00)

## 2013-07-02 LAB — HIV ANTIBODY (ROUTINE TESTING W REFLEX): HIV 1&2 Ab, 4th Generation: NONREACTIVE

## 2013-07-02 LAB — TESTOSTERONE: Testosterone: 806 ng/dL (ref 300–890)

## 2013-07-02 LAB — HEMOGLOBIN A1C
HEMOGLOBIN A1C: 5.2 % (ref <5.7)
MEAN PLASMA GLUCOSE: 103 mg/dL (ref <117)

## 2013-07-02 LAB — GC/CHLAMYDIA PROBE AMP
CT PROBE, AMP APTIMA: NEGATIVE
GC Probe RNA: NEGATIVE

## 2013-07-02 LAB — RPR

## 2013-07-02 MED ORDER — SILDENAFIL CITRATE 50 MG PO TABS: ORAL_TABLET | ORAL | Status: DC

## 2013-07-02 NOTE — Progress Notes (Signed)
Unable to reach patient by mobile or home number.

## 2013-07-02 NOTE — Progress Notes (Signed)
Unable to LM for patient.

## 2013-07-07 ENCOUNTER — Telehealth: Payer: Self-pay | Admitting: Family Medicine

## 2013-07-07 NOTE — Telephone Encounter (Signed)
Patient called and said that he went to the pharmacy to pick up the Viagra and it was going to cost him $ 400.00 dollars. Can you give him something else? CLS

## 2013-07-08 NOTE — Telephone Encounter (Signed)
Have him ask pharmacist/insurer what is preferred - Levitra, Cialis, Stendra?

## 2013-07-08 NOTE — Telephone Encounter (Signed)
Provided information to patient, he will call back with answer from ins/pharmacist

## 2013-07-08 NOTE — Telephone Encounter (Signed)
LM to CB

## 2014-02-07 ENCOUNTER — Encounter (HOSPITAL_COMMUNITY): Payer: Self-pay | Admitting: Emergency Medicine

## 2014-02-07 ENCOUNTER — Emergency Department (INDEPENDENT_AMBULATORY_CARE_PROVIDER_SITE_OTHER)
Admission: EM | Admit: 2014-02-07 | Discharge: 2014-02-07 | Disposition: A | Discharge status: Home / Self Care | Payer: Self-pay | Source: Home / Self Care | Attending: Family Medicine | Admitting: Family Medicine

## 2014-02-07 ENCOUNTER — Ambulatory Visit (HOSPITAL_COMMUNITY): Payer: Self-pay | Attending: Family Medicine

## 2014-02-07 DIAGNOSIS — R05 Cough: Secondary | ICD-10-CM

## 2014-02-07 DIAGNOSIS — R51 Headache: Secondary | ICD-10-CM | POA: Insufficient documentation

## 2014-02-07 DIAGNOSIS — R059 Cough, unspecified: Secondary | ICD-10-CM

## 2014-02-07 DIAGNOSIS — H538 Other visual disturbances: Secondary | ICD-10-CM | POA: Insufficient documentation

## 2014-02-07 DIAGNOSIS — R1110 Vomiting, unspecified: Secondary | ICD-10-CM | POA: Insufficient documentation

## 2014-02-07 MED ORDER — HYDROCODONE-ACETAMINOPHEN 5-325 MG PO TABS: 0.5000 | ORAL_TABLET | Freq: Every evening | ORAL | Status: DC | PRN

## 2014-02-07 MED ORDER — PREDNISONE 10 MG PO TABS: 30.0000 mg | ORAL_TABLET | Freq: Every day | ORAL | Status: DC

## 2014-02-07 MED ORDER — IPRATROPIUM-ALBUTEROL 0.5-2.5 (3) MG/3ML IN SOLN
3.0000 mL | Freq: Once | RESPIRATORY_TRACT | Status: AC
  Administered 2014-02-07: 3 mL via RESPIRATORY_TRACT

## 2014-02-07 MED ORDER — IPRATROPIUM-ALBUTEROL 0.5-2.5 (3) MG/3ML IN SOLN
RESPIRATORY_TRACT | Status: AC
  Filled 2014-02-07: qty 3

## 2014-02-07 NOTE — Discharge Instructions (Signed)
Thank you for coming in today. °Call or go to the emergency room if you get worse, have trouble breathing, have chest pains, or palpitations.  ° ° °Cough, Adult ° A cough is a reflex that helps clear your throat and airways. It can help heal the body or may be a reaction to an irritated airway. A cough may only last 2 or 3 weeks (acute) or may last more than 8 weeks (chronic).  °CAUSES °Acute cough: °· Viral or bacterial infections. °Chronic cough: °· Infections. °· Allergies. °· Asthma. °· Post-nasal drip. °· Smoking. °· Heartburn or acid reflux. °· Some medicines. °· Chronic lung problems (COPD). °· Cancer. °SYMPTOMS  °· Cough. °· Fever. °· Chest pain. °· Increased breathing rate. °· High-pitched whistling sound when breathing (wheezing). °· Colored mucus that you cough up (sputum). °TREATMENT  °· A bacterial cough may be treated with antibiotic medicine. °· A viral cough must run its course and will not respond to antibiotics. °· Your caregiver may recommend other treatments if you have a chronic cough. °HOME CARE INSTRUCTIONS  °· Only take over-the-counter or prescription medicines for pain, discomfort, or fever as directed by your caregiver. Use cough suppressants only as directed by your caregiver. °· Use a cold steam vaporizer or humidifier in your bedroom or home to help loosen secretions. °· Sleep in a semi-upright position if your cough is worse at night. °· Rest as needed. °· Stop smoking if you smoke. °SEEK IMMEDIATE MEDICAL CARE IF:  °· You have pus in your sputum. °· Your cough starts to worsen. °· You cannot control your cough with suppressants and are losing sleep. °· You begin coughing up blood. °· You have difficulty breathing. °· You develop pain which is getting worse or is uncontrolled with medicine. °· You have a fever. °MAKE SURE YOU:  °· Understand these instructions. °· Will watch your condition. °· Will get help right away if you are not doing well or get worse. °Document Released:  07/13/2010 Document Revised: 04/08/2011 Document Reviewed: 07/13/2010 °ExitCare® Patient Information ©2015 Exit[employer], LLC. This information is not intended to replace advice given to you by your health care provider. Make sure you discuss any questions you have with your health care provider. ° °

## 2014-02-07 NOTE — ED Provider Notes (Signed)
Vincent GrebeDarrell Cook is a 49 y.o. male who presents to Urgent Care today for cough and chest congestion sore throat headache and vomiting and sweating. Patient vomited initially but has not had any vomiting for at least one day. Symptoms present for about 4 days. No fevers or chills or abdominal pain. Patient notes mild chest soreness worse with deep inspiration. His pain is nonexertional.   Past Medical History  Diagnosis Date  . Pneumonia 2008    [facility], 3 day hospitalization   Past Surgical History  Procedure Laterality Date  . Knee surgery      left, ligament repair, Dr. Priscille Kluverendall  . Tonsillectomy     History  Substance Use Topics  . Smoking status: Current Every Day Smoker -- 0.50 packs/day for 1 years  . Smokeless tobacco: Not on file  . Alcohol Use: 7.2 oz/week    12 Cans of beer per week   ROS as above Medications: No current facility-administered medications for this encounter.   Current Outpatient Prescriptions  Medication Sig Dispense Refill  . dextromethorphan-guaiFENesin (MUCINEX DM) 30-600 MG per 12 hr tablet Take 1 tablet by mouth 2 (two) times daily.    Marland Kitchen. HYDROcodone-acetaminophen (NORCO/VICODIN) 5-325 MG per tablet Take 0.5 tablets by mouth at bedtime as needed (cough). 10 tablet 0  . predniSONE (DELTASONE) 10 MG tablet Take 3 tablets (30 mg total) by mouth daily. 15 tablet 0  . sildenafil (VIAGRA) 50 MG tablet 1/2-1 tablet po prn, max 1 daily 10 tablet 2   No Known Allergies   Exam:  BP 117/75 mmHg  Pulse 76  Temp(Src) 97.8 F (36.6 C) (Oral)  Resp 16  SpO2 95% Gen: Well NAD HEENT: EOMI,  MMM normal posterior pharynx and tympanic membranes. Lungs: Normal work of breathing. Coarse breath sounds throughout Heart: RRR no MRG Abd: NABS, Soft. Nondistended, Nontender Exts: Brisk capillary refill, warm and well perfused.   Patient was given a 2.5/0.5 mg DuoNeb nebulizer treatment and had mild improvement  No results found for this or any previous visit  (from the past 24 hour(s)). Dg Chest 2 View  02/07/2014   CLINICAL DATA:  Headache, throat swelling, ear pressure, blurred vision, chest tightness and cough. Vomiting.  EXAM: CHEST  2 VIEW  COMPARISON:  CT chest 04/04/2003.  FINDINGS: Trachea is midline. Heart size normal. Lungs are somewhat hyperinflated but clear. No pleural fluid.  IMPRESSION: No acute findings.   Electronically Signed   By: Leanna BattlesMelinda  Blietz M.D.   On: 02/07/2014 11:23    Assessment and Plan: 49 y.o. male with bronchitis. Treat with prednisone and Norco for cough suppression. Follow-up as needed.  Discussed warning signs or symptoms. Please see discharge instructions. Patient expresses understanding.     Rodolph BongEvan S Ariella Voit, MD 02/07/14 [id number]

## 2014-02-07 NOTE — ED Notes (Signed)
Pt states he has been suffering from a headache, throat swelling with pressure up to the ears, blurred vision, chest tightness and cough since Thursday.  He also reports vomiting over the weekend.  Pt has tried Mucinex with no relief.  He also states he has had sweating but does not know if he has had a fever.

## 2014-02-10 ENCOUNTER — Telehealth (HOSPITAL_COMMUNITY): Payer: Self-pay | Admitting: Family Medicine

## 2014-02-10 MED ORDER — AZITHROMYCIN 250 MG PO TABS: 250.0000 mg | ORAL_TABLET | Freq: Every day | ORAL | Status: DC

## 2014-02-10 NOTE — ED Notes (Signed)
Patient not better after 3 days of prednisone.  Will call in Azithromycin.  If not better return to clinic.   Rodolph BongEvan S Avril Busser, MD 02/10/14 1329

## 2014-03-30 ENCOUNTER — Encounter (HOSPITAL_COMMUNITY): Payer: Self-pay

## 2014-03-30 ENCOUNTER — Emergency Department (HOSPITAL_COMMUNITY)
Admission: EM | Admit: 2014-03-30 | Discharge: 2014-03-30 | Disposition: A | Discharge status: Home / Self Care | Payer: Self-pay | Attending: Emergency Medicine | Admitting: Emergency Medicine

## 2014-03-30 ENCOUNTER — Encounter (HOSPITAL_COMMUNITY): Payer: Self-pay | Admitting: *Deleted

## 2014-03-30 ENCOUNTER — Emergency Department (HOSPITAL_COMMUNITY): Payer: Self-pay

## 2014-03-30 ENCOUNTER — Emergency Department (INDEPENDENT_AMBULATORY_CARE_PROVIDER_SITE_OTHER)
Admission: EM | Admit: 2014-03-30 | Discharge: 2014-03-30 | Payer: Self-pay | Source: Home / Self Care | Attending: Emergency Medicine | Admitting: Emergency Medicine

## 2014-03-30 DIAGNOSIS — H9202 Otalgia, left ear: Secondary | ICD-10-CM | POA: Insufficient documentation

## 2014-03-30 DIAGNOSIS — Z79899 Other long term (current) drug therapy: Secondary | ICD-10-CM | POA: Insufficient documentation

## 2014-03-30 DIAGNOSIS — Z792 Long term (current) use of antibiotics: Secondary | ICD-10-CM | POA: Insufficient documentation

## 2014-03-30 DIAGNOSIS — Z7952 Long term (current) use of systemic steroids: Secondary | ICD-10-CM | POA: Insufficient documentation

## 2014-03-30 DIAGNOSIS — Z8701 Personal history of pneumonia (recurrent): Secondary | ICD-10-CM | POA: Insufficient documentation

## 2014-03-30 DIAGNOSIS — H9212 Otorrhea, left ear: Secondary | ICD-10-CM | POA: Insufficient documentation

## 2014-03-30 DIAGNOSIS — L0291 Cutaneous abscess, unspecified: Secondary | ICD-10-CM

## 2014-03-30 DIAGNOSIS — K1120 Sialoadenitis, unspecified: Secondary | ICD-10-CM | POA: Insufficient documentation

## 2014-03-30 DIAGNOSIS — R591 Generalized enlarged lymph nodes: Secondary | ICD-10-CM

## 2014-03-30 DIAGNOSIS — Z720 Tobacco use: Secondary | ICD-10-CM | POA: Insufficient documentation

## 2014-03-30 LAB — COMPREHENSIVE METABOLIC PANEL
ALT: 24 U/L (ref 0–53)
AST: 29 U/L (ref 0–37)
Albumin: 3.7 g/dL (ref 3.5–5.2)
Alkaline Phosphatase: 63 U/L (ref 39–117)
Anion gap: 5 (ref 5–15)
BUN: 14 mg/dL (ref 6–23)
CO2: 25 mmol/L (ref 19–32)
Calcium: 9.1 mg/dL (ref 8.4–10.5)
Chloride: 107 mmol/L (ref 96–112)
Creatinine, Ser: 1.04 mg/dL (ref 0.50–1.35)
GFR calc Af Amer: >90 mL/min (ref >90)
GFR, EST NON AFRICAN AMERICAN: 82 mL/min — AB (ref >90)
Glucose, Bld: 108 mg/dL — ABNORMAL HIGH (ref 70–99)
Potassium: 4.7 mmol/L (ref 3.5–5.1)
SODIUM: 137 mmol/L (ref 135–145)
Total Bilirubin: 0.8 mg/dL (ref 0.3–1.2)
Total Protein: 6.9 g/dL (ref 6.0–8.3)

## 2014-03-30 LAB — CBC
HCT: 45.9 % (ref 39.0–52.0)
Hemoglobin: 15.6 g/dL (ref 13.0–17.0)
MCH: 32.4 pg (ref 26.0–34.0)
MCHC: 34 g/dL (ref 30.0–36.0)
MCV: 95.2 fL (ref 78.0–100.0)
Platelets: 227 10*3/uL (ref 150–400)
RBC: 4.82 MIL/uL (ref 4.22–5.81)
RDW: 12.3 % (ref 11.5–15.5)
WBC: 7.8 10*3/uL (ref 4.0–10.5)

## 2014-03-30 LAB — RAPID HIV SCREEN (WH-MAU): Rapid HIV Screen: NONREACTIVE

## 2014-03-30 MED ORDER — IOHEXOL 300 MG/ML  SOLN
80.0000 mL | Freq: Once | INTRAMUSCULAR | Status: AC | PRN
  Administered 2014-03-30: 80 mL via INTRAVENOUS

## 2014-03-30 MED ORDER — IBUPROFEN 600 MG PO TABS: 600.0000 mg | ORAL_TABLET | Freq: Three times a day (TID) | ORAL | Status: DC | PRN

## 2014-03-30 MED ORDER — IBUPROFEN 800 MG PO TABS
800.0000 mg | ORAL_TABLET | Freq: Once | ORAL | Status: AC
  Administered 2014-03-30: 800 mg via ORAL
  Filled 2014-03-30: qty 1

## 2014-03-30 MED ORDER — AMOXICILLIN-POT CLAVULANATE 875-125 MG PO TABS: 1.0000 | ORAL_TABLET | Freq: Two times a day (BID) | ORAL | Status: DC

## 2014-03-30 MED ORDER — OXYCODONE-ACETAMINOPHEN 5-325 MG PO TABS: 2.0000 | ORAL_TABLET | ORAL | Status: DC | PRN

## 2014-03-30 MED ORDER — AMOXICILLIN-POT CLAVULANATE 875-125 MG PO TABS
1.0000 | ORAL_TABLET | Freq: Once | ORAL | Status: AC
  Administered 2014-03-30: 1 via ORAL
  Filled 2014-03-30: qty 1

## 2014-03-30 MED ORDER — MORPHINE SULFATE 4 MG/ML IJ SOLN: 4.0000 mg | Freq: Once | INTRAMUSCULAR | Status: DC

## 2014-03-30 MED ORDER — TRIAMCINOLONE ACETONIDE 0.1 % EX CREA: 1.0000 "application " | TOPICAL_CREAM | Freq: Three times a day (TID) | CUTANEOUS | Status: DC

## 2014-03-30 MED ORDER — MORPHINE SULFATE 4 MG/ML IJ SOLN
4.0000 mg | Freq: Once | INTRAMUSCULAR | Status: AC
  Administered 2014-03-30: 4 mg via INTRAVENOUS
  Filled 2014-03-30: qty 1

## 2014-03-30 MED ORDER — HYDROMORPHONE HCL 1 MG/ML IJ SOLN
1.0000 mg | Freq: Once | INTRAMUSCULAR | Status: AC
Start: 2014-03-30 — End: 2014-03-30
  Administered 2014-03-30: 1 mg via INTRAVENOUS
  Filled 2014-03-30: qty 1

## 2014-03-30 MED ORDER — SODIUM CHLORIDE 0.9 % IV BOLUS (SEPSIS)
1000.0000 mL | Freq: Once | INTRAVENOUS | Status: AC
  Administered 2014-03-30: 1000 mL via INTRAVENOUS

## 2014-03-30 MED ORDER — HYDROCODONE-ACETAMINOPHEN 5-325 MG PO TABS
2.0000 | ORAL_TABLET | Freq: Once | ORAL | Status: AC
  Administered 2014-03-30: 2 via ORAL

## 2014-03-30 MED ORDER — HYDROCODONE-ACETAMINOPHEN 5-325 MG PO TABS
ORAL_TABLET | ORAL | Status: AC
  Filled 2014-03-30: qty 2

## 2014-03-30 MED ORDER — OXYCODONE-ACETAMINOPHEN 5-325 MG PO TABS
2.0000 | ORAL_TABLET | Freq: Once | ORAL | Status: AC
  Administered 2014-03-30: 2 via ORAL
  Filled 2014-03-30: qty 2

## 2014-03-30 MED ORDER — TRIAMCINOLONE ACETONIDE 0.1 % EX CREA: 1.0000 "application " | TOPICAL_CREAM | Freq: Two times a day (BID) | CUTANEOUS | Status: DC

## 2014-03-30 NOTE — ED Notes (Signed)
mask  Placed  On  The  Pt

## 2014-03-30 NOTE — Discharge Instructions (Signed)
Facial Infection °You have an infection of your face. This requires special attention to help prevent serious problems. Infections in facial wounds can cause poor healing and scars. They can also spread to deeper tissues, especially around the eye. Wound and dental infections can lead to sinusitis, infection of the eye socket, and even meningitis. Permanent damage to the skin, eye, and nervous system may result if facial infections are not treated properly. With severe infections, hospital care for IV antibiotic injections may be needed if they don't respond to oral antibiotics. °Antibiotics must be taken for the full course to insure the infection is eliminated. If the infection came from a bad tooth, it may have to be extracted when the infection is under control. Warm compresses may be applied to reduce skin irritation and remove drainage. °You might need a tetanus shot now if: °· You cannot remember when your last tetanus shot was. °· You have never had a tetanus shot. °· The object that caused your wound was dirty. °If you need a tetanus shot, and you decide not to get one, there is a rare chance of getting tetanus. Sickness from tetanus can be serious. If you got a tetanus shot, your arm may swell, get red and warm to the touch at the shot site. This is common and not a problem. °SEEK IMMEDIATE MEDICAL CARE IF:  °· You have increased swelling, redness, or trouble breathing. °· You have a severe headache, dizziness, nausea, or vomiting. °· You develop problems with your eyesight. °· You have a fever. °Document Released: 02/22/2004 Document Revised: 04/08/2011 Document Reviewed: 01/14/2005 °ExitCare® Patient Information ©2015 Exit[employer], LLC. This information is not intended to replace advice given to you by your health care provider. Make sure you discuss any questions you have with your health care provider. ° °

## 2014-03-30 NOTE — ED Provider Notes (Signed)
Chief Complaint   Facial Swelling   History of Present Illness   Vincent Cook is a 49 year old male who woke up this morning with pain and swelling which extends from behind the left ear, the left side of the neck, 2 in front of the left ear. He also has some swelling and pain around the right ear as well. He denies any fever or chills, but has had headache, photophobia, stiff neck. He denies any nasal congestion or drainage. No sore throat or intraoral lesions. He denies any coughing or shortness of breath.  Review of Systems   Other than as noted above, the patient denies any of the following symptoms: Systemic:  No fevers or chills. Eye:  No redness, pain, discharge, itching, blurred vision, or diplopia. ENT:  No headache, nasal congestion, sneezing, itching, epistaxis, ear pain, decreased hearing, ringing in ears, vertigo, or tinnitus.  No oral lesions, sore throat, or hoarseness. Neck:  No neck pain or adenopathy. Skin:  No rash or itching.  PMFSH   Past medical history, family history, social history, meds, and allergies were reviewed.   Physical Examination     Vital signs:  BP 146/84 mmHg  Pulse 92  Temp(Src) 98.7 F (37.1 C) (Oral)  Resp 16  SpO2 97% General:  Alert and oriented.  In no distress.   Eye:  PERRL, full EOMs, lids and conjunctiva normal.   ENT:  TMs and canals clear.  Nasal mucosa not congested and without drainage.  Mucous membranes moist, no oral lesions, normal dentition, pharynx clear. There is some swelling and erythema of Stensen's ducts. There is no pain or swelling over the mastoid. He has a very small, fluctuant abscess behind the left ear measuring 1 mm x 8 mm. This has a small central draining sinus. He has a large, indurated, tender mass at the left angle of jaw and there is swelling of the entire parotid area on the left. He also has mild swelling and tenderness to palpation of the parotid gland on the right. Neck:  Supple, full ROM.  No  adenopathy, tenderness or mass.  Thyroid normal. Skin:  Clear, warm and dry.      Course in Urgent Care Center   The following medications were given:  Medications  HYDROcodone-acetaminophen (NORCO/VICODIN) 5-325 MG per tablet 2 tablet (not administered)   Assessment   The primary encounter diagnosis was Abscess. Diagnoses of Lymphadenopathy and Parotitis were also pertinent to this visit.  He has a tiny abscess behind his left ear with what may be an enlarged lymph node at the left angle of the jaw and it also appears to be some facial cellulitis on the left. Both parotid glands are somewhat enlarged and tender, left more so than right he does appear to have some swelling of Stensen's duct, raising the question of parotitis or mumps as well. What concerns me the most is the facial cellulitis. My thought would be that he would need a CT scan of the area, followed by IV antibiotics. Finally, there are also remained the possibility of mumps irritable sent him over and a mask, and mumps serologies may be warranted.  Plan    The patient was transferred to the ED via shuttle in stable condition.  Medical Decision Making:  The patient is a 48 year old male who woke this morning with swelling behind his left ear which extended all the way into his neck and into the facial area in front of the ear. This was very tender  to touch. The abscess drained a little bit of pus. He denies any fever but did have some headache, photophobia, stiff neck. On exam there is marked facial swelling on the left. He has a tiny abscess behind his ear which doesn't look fairly remarkable. This is out of proportion to the amount of swelling he has around the parotid area. My thoughts are is this cellulitis or could he have mumps. I think he needs further evaluation the emergency room.     Reuben Likesavid C Dalia Jollie, MD 03/30/14 [phone]

## 2014-03-30 NOTE — ED Notes (Signed)
Pt  Is   In  Private  Room        And  Is  Masked

## 2014-03-30 NOTE — Discharge Instructions (Signed)
We have determined that your problem requires further evaluation in the emergency department.  We will take care of your transport there.  Once at the emergency department, you will be evaluated by a provider and they will order whatever treatment or tests they deem necessary.  We cannot guarantee that they will do any specific test or do any specific treatment.  ° °

## 2014-03-30 NOTE — ED Notes (Signed)
Note from urgent care: cellulitis or rule out mumps.

## 2014-03-30 NOTE — ED Notes (Signed)
Pt  Reports     Swollen     And  Tender area   Behind     l  Ear      This  Am         -  The  Area is  Tender  To  The    Touch        -          He  Also  Has  A  Dry  scaley  Area  To r   Forearm           He  Is  Awake  And  Alert  And  Oriented    His  Skin is  Warm and  Dry

## 2014-03-30 NOTE — ED Provider Notes (Signed)
CSN: [id number]     Arrival date & time 03/30/14  1328 History   First MD Initiated Contact with Patient 03/30/14 1454     Chief Complaint  Patient presents with  . Adenopathy  . Headache     (Consider location/radiation/quality/duration/timing/severity/associated sxs/prior Treatment) HPI   49 yo M with no significant PMHx who presents with a 1-day history of left ear pain, headache, and drainage from behind his ear. Patient states he felt well going to bed last night but awoke this morning with pain and swelling behind his left ear. Denies any recent trauma to the area. He rubbed the area behind his ear and a small amount of pus "drained out" from behind his ear. He then noticed increasing swelling around his ear and lower jaw and subsequently presented to urgent care, who referred him to the ED for evaluation of possible parotiditis versus mumps versus facial abscess. He has a history of "infected sweat gland" of the left ear but no other known h/o abscess. He describes his pain as a dull, throbbing, 7/10 pain in the left periauricular area that is made worse with palpation and movement. He has not tried to eat anything. Denies any known alleviating factors. No other medical problems. He has had a small rash in his right AC that has been present x 2 weeks but his facial swelling just started today. No recent travel outside of [place]. Vaccination status unknown. No testicular pain or swelling. No abdominal pain, nausea, or vomiting.  Past Medical History  Diagnosis Date  . Pneumonia 2008    [name], 3 day hospitalization   Past Surgical History  Procedure Laterality Date  . Knee surgery      left, ligament repair, Dr. Priscille Kluver  . Tonsillectomy     Family History  Problem Relation Age of Onset  . Diabetes Mother   . Diabetes Brother   . Cancer Maternal Uncle     prostate  . Heart disease Neg Hx   . Hypertension Neg Hx   . Stroke Neg Hx    History  Substance Use Topics  . Smoking  status: Current Every Day Smoker -- 0.50 packs/day for 1 years  . Smokeless tobacco: Not on file  . Alcohol Use: 7.2 oz/week    12 Cans of beer per week    Review of Systems  Constitutional: Positive for chills. Negative for fever and fatigue.  HENT: Positive for ear discharge, ear pain and facial swelling. Negative for congestion, dental problem, rhinorrhea, sore throat, trouble swallowing and voice change.   Eyes: Negative for visual disturbance.  Respiratory: Negative for cough, shortness of breath and wheezing.   Cardiovascular: Negative for chest pain and leg swelling.  Gastrointestinal: Negative for nausea, vomiting, abdominal pain and diarrhea.  Genitourinary: Negative for dysuria and flank pain.  Musculoskeletal: Negative for neck pain and neck stiffness.  Skin: Negative for rash.  Neurological: Negative for dizziness, weakness and headaches.      Allergies  Review of patient's allergies indicates no known allergies.  Home Medications   Prior to Admission medications   Medication Sig Start Date End Date Taking? Authorizing Provider  azithromycin (ZITHROMAX) 250 MG tablet Take 1 tablet (250 mg total) by mouth daily. Take first 2 tablets together, then 1 every day until finished. 02/10/14   Rodolph Bong, MD  dextromethorphan-guaiFENesin [facility] DM) 30-600 MG per 12 hr tablet Take 1 tablet by mouth 2 (two) times daily.    Historical Provider, MD  HYDROcodone-acetaminophen (NORCO/VICODIN)  5-325 MG per tablet Take 0.5 tablets by mouth at bedtime as needed (cough). 02/07/14   Rodolph Bong, MD  predniSONE (DELTASONE) 10 MG tablet Take 3 tablets (30 mg total) by mouth daily. 02/07/14   Rodolph Bong, MD  sildenafil (VIAGRA) 50 MG tablet 1/2-1 tablet po prn, max 1 daily 07/02/13   Kermit Balo Tysinger, PA-C  triamcinolone cream (KENALOG) 0.1 % Apply 1 application topically 3 (three) times daily. 03/30/14   Reuben Likes, MD   BP 111/60 mmHg  Pulse 65  Temp(Src) 98 F (36.7 C)  Resp 18   Ht  (1.803 m)  Wt 215 lb (97.523 kg)  BMI 30.00 kg/m2  SpO2 96% Physical Exam  Constitutional: He is oriented to person, place, and time. He appears well-developed and well-nourished. No distress.  HENT:  Head: Normocephalic.  Right Ear: Hearing, tympanic membrane and external ear normal. No mastoid tenderness.  Left Ear: Hearing, tympanic membrane and external ear normal. No mastoid tenderness.  Nose: No mucosal edema or rhinorrhea.  Small, <1 x 1 cm fluctuant area to left posterior auricular fold draining small amount of yellow pus. Large, firm, tender swelling noted extending from posterior inferior auricular area around angle of mandible, with TTP over angle of mandible. No significant warmth of fluctuance. Mild tenderness in right posterior auricular fold but without any fluctuance or swelling. No sublingual swelling or induration. Phonation normal. No peritonsillar asymmetry, uvula is midline. No dental abscess or abnormality noted. No overlying skin erythema.  Eyes: Conjunctivae are normal. Pupils are equal, round, and reactive to light.  Neck: Neck supple.  Cardiovascular: Normal rate, normal heart sounds and intact distal pulses.  Exam reveals no friction rub.   No murmur heard. Pulmonary/Chest: Effort normal and breath sounds normal. No respiratory distress. He has no wheezes. He has no rales.  Abdominal: Soft. Bowel sounds are normal. He exhibits no distension. There is no tenderness.  Musculoskeletal: He exhibits no edema.  Neurological: He is alert and oriented to person, place, and time. He has normal strength. No cranial nerve deficit or sensory deficit. He exhibits normal muscle tone. Coordination normal.  Skin: Skin is warm. No rash noted.  Nursing note and vitals reviewed.   ED Course  Procedures (including critical care time) Labs Review Labs Reviewed  COMPREHENSIVE METABOLIC PANEL - Abnormal; Notable for the following:    Glucose, Bld 108 (*)    GFR calc non Af  Amer 82 (*)    All other components within normal limits  CBC  RAPID HIV SCREEN ([facility])  MUMPS ANTIBODY, IGG  MUMPS ANTIBODY, IGM  CMV IGM  HIV ANTIBODY (ROUTINE TESTING)    Imaging Review Ct Maxillofacial W/cm  03/30/2014   CLINICAL DATA:  Pain and swelling of left side of neck and face.  EXAM: CT MAXILLOFACIAL WITH CONTRAST  TECHNIQUE: Multidetector CT imaging of the maxillofacial structures was performed with intravenous contrast. Multiplanar CT image reconstructions were also generated. A small metallic BB was placed on the right temple in order to reliably differentiate right from left.  CONTRAST:  80mL OMNIPAQUE IOHEXOL 300 MG/ML  SOLN  COMPARISON:  None.  FINDINGS: No soft tissue swelling or abnormal fluid collection is identified. No soft tissue mass identified. The airway is normally patent. No evidence of lymphadenopathy. Salivary glands are symmetric in appearance and normal bilaterally.  Small mucous retention cyst is noted in the lateral left maxillary antrum. No evidence of sinus mucosal thickening or air-fluid levels. No vascular abnormalities are  identified. Bony structures of the base, visualized skull and visualize neck are within normal limits. Orbits show a normal appearance to the globes bilaterally an the extraocular muscles.  IMPRESSION: Unremarkable maxillofacial CT. No evidence of masses, lymphadenopathy or acute inflammatory process.   Electronically Signed   By: Irish Lack M.D.   On: 03/30/2014 17:48     EKG Interpretation   Date/Time:  Wednesday March 30 2014 16:35:34 EST Ventricular Rate:  66 PR Interval:  148 QRS Duration: 107 QT Interval:  432 QTC Calculation: 453 R Axis:   3 Text Interpretation:  Sinus rhythm Abnormal R-wave progression, early  transition No significant change was found Confirmed by Manus Gunning  MD,  STEPHEN [phone]) on 03/30/2014 4:39:26 PM      MDM   49 yo M with no significant PMHx who presents with a 1-day history of left ear pain,  left jaw swelling, and drainage from behind his ear, with swelling over his parotid gland. See hPI above. ON arrival, T 21F< HR 68, RR 18, BP 116/69, satting 97% on RA. Exam as above, remarkable for TTP and swelling over parotid and submandibular area with no overlying skin changes. Remainder as above.  Pt's presentation is most consistent with acute onset parotitis, with concern for possible viral etiology such as mumps versus other infectious parotitis with possible submandibular sialoadenitis. No significant warmth, fluctuance, or evidence of abscess. No apparent odontogenic source. TMs clear bilaterally without signs of AOM, OE, and mastoid non-tender without evidence of mastoiditis. Pt is otherwise afebrile, well-appearing, without diabetes or other significant risk factors for systemic infection. No recent fever, chills, weight loss, cough, or s/s to suggest underlying autoimmune condition such as sarcoidosis or Sjogren's. No high risk behavior but acute HIV infection, CMV infection is also in DDx. No h/o HIV. Regarding possible cellulitis, the skin ovelrying the area of swelling is soft, non-indurated, and without warmth to suggest cellulitis. He does have a small draining cyst behind the ear but he has a history of this and I do not suspect abscess with cellulitis. No sublingual swelling or signs of Ludwig's. Airway widely patent. No signs of PTA or RPA. Will obtain basic labs, plan for CT Face/SInuses for further evaluation. Swelling may be reactive lymphadenopathy as well.  CBC with no leukocytosis or anemia. CMP unremarkable. CT Face/Sinuses shows no significant suppurative parotid swelling, abscess, cellulitis, or other acute abnormality. Pain improved and pt remains afebrile, well-appearing, and requesting PO intake. Will give PO analgesics and discuss with Infectious DIsease.  Discussed case with ID. They recommend rapid HIV, mumps titers, and CMV with treatment for possible parotitis. Will give  Augmentin, and f/u HIV.  HIV negative. Pt tolerating PO. He remains well-appearing without any SIRS criteria. Given o/w well appearance and negative CT, will treat with Augmentin, oxycodone, ibuprofen, and d/c with PCP f/u in 24-48 hours. Pt in agreement with this plan. Infectious disease precautions discussed.  Clinical Impression: 1. Parotiditis   2. Left ear pain     Disposition: Discharge  Condition: Good  I have discussed the results, Dx and Tx plan with the pt(& family if present). He/she/they expressed understanding and agree(s) with the plan. Discharge instructions discussed at great length. Strict return precautions discussed and pt &/or family have verbalized understanding of the instructions. No further questions at time of discharge.    Discharge Medication List as of 03/30/2014  7:38 PM    START taking these medications   Details  amoxicillin-clavulanate (AUGMENTIN) 875-125 MG per tablet Take  1 tablet by mouth 2 (two) times daily., Starting 03/30/2014, Until Discontinued, Print    ibuprofen (ADVIL,MOTRIN) 600 MG tablet Take 1 tablet (600 mg total) by mouth every 8 (eight) hours as needed., Starting 03/30/2014, Until Discontinued, Print    oxyCODONE-acetaminophen (PERCOCET/ROXICET) 5-325 MG per tablet Take 2 tablets by mouth every 4 (four) hours as needed for severe pain., Starting 03/30/2014, Until Discontinued, Print        Follow Up: Jac CanavanDavid S Tysinger, PA-C [address] [place] [id number] [phone]   Follow-up in 2 days for repeat exam and check-up   Pt seen in conjunction with Dr. Ignacia Fellingancour       Nautika Cressey, MD 03/31/14 1223  Glynn OctaveStephen Rancour, MD 03/31/14 1323

## 2014-03-30 NOTE — ED Notes (Signed)
Pt. Reports sudden onset of HA, swelling to lymph nodes (under ears) and facial swelling, also reports rash to right arm since this AM. Denies recent travel. Denies knowing vaccination status. Denies being around anyone who is sick. Alert and oriented x4.

## 2014-03-31 LAB — MUMPS ANTIBODY, IGG

## 2014-03-31 LAB — CMV IGM: CMV IgM: <30 AU/mL (ref 0.0–29.9)

## 2014-03-31 LAB — MUMPS ANTIBODY, IGM: Mumps IgM: <1:20 {titer}

## 2014-03-31 LAB — HIV ANTIBODY (ROUTINE TESTING W REFLEX): HIV Screen 4th Generation wRfx: NONREACTIVE

## 2014-04-04 ENCOUNTER — Encounter (HOSPITAL_COMMUNITY): Payer: Self-pay | Admitting: Emergency Medicine

## 2014-04-04 ENCOUNTER — Emergency Department (HOSPITAL_COMMUNITY)
Admission: EM | Admit: 2014-04-04 | Discharge: 2014-04-04 | Disposition: A | Discharge status: Home / Self Care | Payer: 59 | Attending: Emergency Medicine | Admitting: Emergency Medicine

## 2014-04-04 DIAGNOSIS — Z8701 Personal history of pneumonia (recurrent): Secondary | ICD-10-CM | POA: Diagnosis not present

## 2014-04-04 DIAGNOSIS — Z720 Tobacco use: Secondary | ICD-10-CM | POA: Insufficient documentation

## 2014-04-04 DIAGNOSIS — K1120 Sialoadenitis, unspecified: Secondary | ICD-10-CM

## 2014-04-04 DIAGNOSIS — Z0289 Encounter for other administrative examinations: Secondary | ICD-10-CM | POA: Diagnosis present

## 2014-04-04 NOTE — ED Notes (Signed)
Pt here for a couple of more days out of work; pt sts improving pain behind left ear but still having some pain; pt feels is improving accordingly

## 2014-04-04 NOTE — Discharge Instructions (Signed)
1. Medications: continue treatments for your parotitis, usual home medications 2. Treatment: rest, drink plenty of fluids,  3. Follow Up: Please followup with your primary doctor in 3 days for discussion of your diagnoses and further evaluation after today's visit; if you do not have a primary care doctor use the resource guide provided to find one; Please return to the ER for high fevers, difficulty swallowing or worsening symptoms    Parotitis Parotitis is soreness and inflammation of one or both parotid glands. The parotid glands produce saliva. They are located on each side of the face, below and in front of the earlobes. The saliva produced comes out of tiny openings (ducts) inside the cheeks. In most cases, parotitis goes away over time or with treatment. If your parotitis is caused by certain long-term (chronic) diseases, it may come back again.  CAUSES  Parotitis can be caused by:  Viral infections. Mumps is one viral infection that can cause parotitis.  Bacterial infections.  Blockage of the salivary ducts due to a salivary stone.  Narrowing of the salivary ducts.  Swelling of the salivary ducts.  Dehydration.  Autoimmune conditions, such as sarcoidosis or Sjogren syndrome.  Air from activities such as scuba diving, glass blowing, or playing an instrument (rare).  Human immunodeficiency virus (HIV) or acquired immunodeficiency syndrome (AIDS).  Tuberculosis. SIGNS AND SYMPTOMS   The ears may appear to be pushed up and out from their normal position.  Redness (erythema) of the skin over the parotid glands.  Pain and tenderness over the parotid glands.  Swelling in the parotid gland area.  Yellowish-white fluid (pus) coming from the ducts inside the cheeks.  Dry mouth.  Bad taste in the mouth. DIAGNOSIS  Your health care provider may determine that you have parotitis based on your symptoms and a physical exam. A sample of fluid may also be taken from the parotid  gland and tested to find the cause of your infection. X-rays or computed tomography (CT) scans may be taken if your health care provider thinks you might have a salivary stone blocking your salivary duct. TREATMENT  Treatment varies depending upon the cause of your parotitis. If your parotitis is caused by mumps, no treatment is needed. The condition will go away on its own after 7 to 10 days. In other cases, treatment may include:  Antibiotic medicine if your infection was caused by bacteria.  Pain medicines.  Gland massage.  Eating sour candy to increase your saliva production.  Removal of salivary stones. Your health care provider may flush stones out with fluids or remove them with tweezers.  Surgery to remove the parotid glands. HOME CARE INSTRUCTIONS   If you were prescribed an antibiotic medicine, finish it all even if you start to feel better.  Put warm compresses on the sore area.  Take medicines only as directed by your health care provider.  Drink enough fluids to keep your urine clear or pale yellow. SEEK IMMEDIATE MEDICAL CARE IF:   You have increasing pain or swelling that is not controlled with medicine.  You have a fever. MAKE SURE YOU:  Understand these instructions.  Will watch your condition.  Will get help right away if you are not doing well or get worse. Document Released: 07/06/2001 Document Revised: 05/31/2013 Document Reviewed: 12/10/2010 [facility] Patient Information 2015 [employer] [place]. This information is not intended to replace advice given to you by your health care provider. Make sure you discuss any questions you have with your health care  provider.

## 2014-04-04 NOTE — ED Notes (Signed)
Pt states he was seen on march 2nd with dx of mumps, pt states the medication seems to be working but he feels he may need to be out of work a few more days.

## 2014-04-04 NOTE — ED Provider Notes (Signed)
CSN: [id number]     Arrival date & time 04/04/14  1638 History   First MD Initiated Contact with Patient 04/04/14 1648     Chief Complaint  Patient presents with  . Letter for School/Work     (Consider location/radiation/quality/duration/timing/severity/associated sxs/prior Treatment) The history is provided by the patient and medical records. No language interpreter was used.     Vincent Cook is a 49 y.o. male  with no major medical history presents to the Emergency Department 4 days after being diagnosed with parotiditis.  He had labs and a CT scan at that time. He reports he was given pain medicine, antibiotics. He reports that his symptoms have continued to decrease in the swelling and the left side of his face has decreased however he still has pain and is continuing to take his Percocet for pain control. 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 He request a doctor's note for the next several days.   Past Medical History  Diagnosis Date  . Pneumonia 2008    [facility], 3 day hospitalization   Past Surgical History  Procedure Laterality Date  . Knee surgery      left, ligament repair, Dr. Priscille Kluverendall  . Tonsillectomy     Family History  Problem Relation Age of Onset  . Diabetes Mother   . Diabetes Brother   . Cancer Maternal Uncle     prostate  . Heart disease Neg Hx   . Hypertension Neg Hx   . Stroke Neg Hx    History  Substance Use Topics  . Smoking status: Current Every Day Smoker -- 0.50 packs/day for 1 years  . Smokeless tobacco: Not on file  . Alcohol Use: 7.2 oz/week    12 Cans of beer per week    Review of Systems  Constitutional: Negative for fever, appetite change and fatigue.  Respiratory: Negative for cough, chest tightness and shortness of breath.   Cardiovascular: Negative for chest pain.  Gastrointestinal: Negative for nausea, vomiting, abdominal pain and diarrhea.  Genitourinary: Negative  for dysuria, urgency and frequency.  Musculoskeletal: Negative for myalgias, arthralgias and neck stiffness.  Skin: Negative for rash.  Neurological: Negative for light-headedness and headaches.  All other systems reviewed and are negative.     Allergies  Review of patient's allergies indicates no known allergies.  Home Medications   Prior to Admission medications   Medication Sig Start Date End Date Taking? Authorizing Provider  amoxicillin-clavulanate (AUGMENTIN) 875-125 MG per tablet Take 1 tablet by mouth 2 (two) times daily. 03/30/14   Shaune Pollackameron Isaacs, MD  azithromycin (ZITHROMAX) 250 MG tablet Take 1 tablet (250 mg total) by mouth daily. Take first 2 tablets together, then 1 every day until finished. Patient not taking: Reported on 03/30/2014 02/10/14   Rodolph BongEvan S Corey, MD  HYDROcodone-acetaminophen (NORCO/VICODIN) 5-325 MG per tablet Take 0.5 tablets by mouth at bedtime as needed (cough). Patient not taking: Reported on 03/30/2014 02/07/14   Rodolph BongEvan S Corey, MD  ibuprofen (ADVIL,MOTRIN) 600 MG tablet Take 1 tablet (600 mg total) by mouth every 8 (eight) hours as needed. 03/30/14   Shaune Pollackameron Isaacs, MD  oxyCODONE-acetaminophen (PERCOCET/ROXICET) 5-325 MG per tablet Take 2 tablets by mouth every 4 (four) hours as needed for severe pain. 03/30/14   Shaune Pollackameron Isaacs, MD  predniSONE (DELTASONE) 10 MG tablet Take 3 tablets (30 mg total) by mouth daily. Patient not taking: Reported on 03/30/2014 02/07/14   Rodolph BongEvan S Corey,  MD  sildenafil (VIAGRA) 50 MG tablet 1/2-1 tablet po prn, max 1 daily Patient not taking: Reported on 03/30/2014 07/02/13   Kermit Balo Tysinger, PA-C  triamcinolone cream (KENALOG) 0.1 % Apply 1 application topically 2 (two) times daily. 03/30/14   Shaune Pollack, MD   BP 134/84 mmHg  Pulse 86  Temp(Src) 98.3 F (36.8 C)  Resp 16  Ht  (1.803 m)  Wt 220 lb (99.791 kg)  BMI 30.70 kg/m2  SpO2 95% Physical Exam  Constitutional: He appears well-developed and well-nourished. No distress.  Awake,  alert, nontoxic appearance  HENT:  Head: Normocephalic and atraumatic.  Mouth/Throat: No oropharyngeal exudate.  Swelling noted to the left face and over the left parotid gland; mild tenderness to palpation; no significant erythema or induration; no open wounds  Eyes: Conjunctivae are normal. No scleral icterus.  Neck: Normal range of motion. Neck supple.  Cardiovascular: Normal rate, regular rhythm and intact distal pulses.   Pulmonary/Chest: Effort normal. No respiratory distress.  Equal chest expansion  Musculoskeletal: Normal range of motion. He exhibits no edema.  Neurological: He is alert.  Speech is clear and goal oriented Moves extremities without ataxia  Skin: Skin is warm and dry. He is not diaphoretic.  Psychiatric: He has a normal mood and affect.  Nursing note and vitals reviewed.   ED Course  Procedures (including critical care time) Labs Review Labs Reviewed - No data to display  Imaging Review No results found.   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 His parotitis is improving.  He is afebrile and not tachycardic urine the emergency department. Will write a work note and discharge home.  BP 134/84 mmHg  Pulse 86  Temp(Src) 98.3 F (36.8 C)  Resp 16  Ht  (1.803 m)  Wt 220 lb (99.791 kg)  BMI 30.70 kg/m2  SpO2 95%   Dierdre Forth, PA-C 04/04/14 1733  Jerelyn Scott, MD 04/04/14 1734

## 2014-05-04 ENCOUNTER — Other Ambulatory Visit: Payer: Self-pay | Admitting: Medical

## 2014-05-04 ENCOUNTER — Telehealth: Payer: Self-pay | Admitting: Medical

## 2014-05-04 MED ORDER — SILDENAFIL CITRATE 20 MG PO TABS: 20.0000 mg | ORAL_TABLET | Freq: Every day | ORAL | Status: DC | PRN

## 2014-05-04 NOTE — Telephone Encounter (Signed)
Med sent, f/u in June for physical

## 2014-05-04 NOTE — Telephone Encounter (Signed)
Wife called & states pt never filled the Viagra because they couldn't afford it, insurance will now pay for generic Sild[facility]afil #30 20mg  [facility] [place] [place].  They ask that you send in Rx for generic

## 2014-05-04 NOTE — Telephone Encounter (Signed)
Pt called to check on status. Please call pt when done.

## 2014-05-05 NOTE — Telephone Encounter (Signed)
LM to patient needs physical in June.

## 2016-01-29 DIAGNOSIS — N529 Male erectile dysfunction, unspecified: Secondary | ICD-10-CM

## 2016-01-29 HISTORY — DX: Male erectile dysfunction, unspecified: N52.9

## 2016-11-17 ENCOUNTER — Encounter (HOSPITAL_BASED_OUTPATIENT_CLINIC_OR_DEPARTMENT_OTHER): Payer: Self-pay | Admitting: Emergency Medicine

## 2016-11-17 ENCOUNTER — Emergency Department (HOSPITAL_BASED_OUTPATIENT_CLINIC_OR_DEPARTMENT_OTHER)
Admission: EM | Admit: 2016-11-17 | Discharge: 2016-11-17 | Disposition: A | Discharge status: Home / Self Care | Payer: Self-pay | Attending: Emergency Medicine | Admitting: Emergency Medicine

## 2016-11-17 DIAGNOSIS — L738 Other specified follicular disorders: Secondary | ICD-10-CM | POA: Insufficient documentation

## 2016-11-17 DIAGNOSIS — F17200 Nicotine dependence, unspecified, uncomplicated: Secondary | ICD-10-CM | POA: Insufficient documentation

## 2016-11-17 MED ORDER — CLINDAMYCIN HCL 150 MG PO CAPS: 300.0000 mg | ORAL_CAPSULE | Freq: Four times a day (QID) | ORAL | 0 refills | Status: AC

## 2016-11-17 NOTE — ED Triage Notes (Signed)
Patient states that he has had multiple abscesses to his face over the last 3 months,. He reports that the newst one is to the left neck.

## 2016-11-17 NOTE — ED Notes (Signed)
ED Provider at bedside. 

## 2016-11-17 NOTE — ED Provider Notes (Signed)
[facility] EMERGENCY DEPARTMENT Provider Note   CSN: [id number] Arrival date & time: 11/17/16  1050     History   Chief Complaint Chief Complaint  Patient presents with  . Abscess    HPI Vincent Cook is a 51 y.o. male.  51yo M who p/w abscesses on face. Pt reports 6 weeks of problems with different areas of pustules and abscesses on face especially within beard. At first he tried to shave to see if it would help but the areas keep reoccurring in different spots. He denies any hot tub exposure. No fevers, vomiting, recent illness, or other complaints. He has noticed some swollen lymph nodes on his neck.   The history is provided by the patient.  Abscess    Past Medical History:  Diagnosis Date  . Pneumonia 2008   [place], 3 day hospitalization    There are no active problems to display for this patient.   Past Surgical History:  Procedure Laterality Date  . KNEE SURGERY     left, ligament repair, Dr. Priscille Kluver  . TONSILLECTOMY         Home Medications    Prior to Admission medications   Medication Sig Start Date End Date Taking? Authorizing Provider  amoxicillin-clavulanate (AUGMENTIN) 875-125 MG per tablet Take 1 tablet by mouth 2 (two) times daily. 03/30/14   Shaune Pollack, MD  azithromycin (ZITHROMAX) 250 MG tablet Take 1 tablet (250 mg total) by mouth daily. Take first 2 tablets together, then 1 every day until finished. Patient not taking: Reported on 03/30/2014 02/10/14   Rodolph Bong, MD  clindamycin (CLEOCIN) 150 MG capsule Take 2 capsules (300 mg total) by mouth 4 (four) times daily. 11/17/16 11/24/16  Lelania Bia, Ambrose Finland, MD  HYDROcodone-acetaminophen (NORCO/VICODIN) 5-325 MG per tablet Take 0.5 tablets by mouth at bedtime as needed (cough). Patient not taking: Reported on 03/30/2014 02/07/14   Rodolph Bong, MD  ibuprofen (ADVIL,MOTRIN) 600 MG tablet Take 1 tablet (600 mg total) by mouth every 8 (eight) hours as needed. 03/30/14   Shaune Pollack, MD  oxyCODONE-acetaminophen (PERCOCET/ROXICET) 5-325 MG per tablet Take 2 tablets by mouth every 4 (four) hours as needed for severe pain. 03/30/14   Shaune Pollack, MD  predniSONE (DELTASONE) 10 MG tablet Take 3 tablets (30 mg total) by mouth daily. Patient not taking: Reported on 03/30/2014 02/07/14   Rodolph Bong, MD  sildenafil (REVATIO) 20 MG tablet Take 1 tablet (20 mg total) by mouth daily as needed. 05/04/14   Tysinger, Kermit Balo, PA-C  sildenafil (VIAGRA) 50 MG tablet 1/2-1 tablet po prn, max 1 daily Patient not taking: Reported on 03/30/2014 07/02/13   Tysinger, Kermit Balo, PA-C  triamcinolone cream (KENALOG) 0.1 % Apply 1 application topically 2 (two) times daily. 03/30/14   Shaune Pollack, MD    Family History Family History  Problem Relation Age of Onset  . Diabetes Mother   . Diabetes Brother   . Cancer Maternal Uncle        prostate  . Heart disease Neg Hx   . Hypertension Neg Hx   . Stroke Neg Hx     Social History Social History  Substance Use Topics  . Smoking status: Current Every Day Smoker    Packs/day: 0.50    Years: 1.00  . Smokeless tobacco: Never Used  . Alcohol use 7.2 oz/week    12 Cans of beer per week     Allergies   Patient has no known allergies.  Review of Systems Review of Systems All other systems reviewed and are negative except that which was mentioned in HPI   Physical Exam Updated Vital Signs BP 120/80 (BP Location: Left Arm)   Pulse 66   Temp 98.6 F (37 C) (Oral)   Resp 18   Ht 5\' 11"  (1.803 m)   Wt 97.1 kg (214 lb)   SpO2 100%   BMI 29.85 kg/m   Physical Exam  Constitutional: He is oriented to person, place, and time. He appears well-developed and well-nourished. No distress.  HENT:  Head: Normocephalic and atraumatic.  Nose: Nose normal.  Eyes: Conjunctivae are normal.  Neck: Neck supple. No tracheal deviation present.  Circular, firm, mobile, mildly tender mass just left of trachea on anterior neck  Lymphadenopathy:     He has cervical adenopathy.  Neurological: He is alert and oriented to person, place, and time.  Skin: Skin is warm and dry.  Scattered pustules, some crusted or already draining, along beard line on neck, lateral cheeks, up to sides of face near scalp line  Psychiatric: He has a normal mood and affect. Judgment normal.  Nursing note and vitals reviewed.    ED Treatments / Results  Labs (all labs ordered are listed, but only abnormal results are displayed) Labs Reviewed - No data to display  EKG  EKG Interpretation None       Radiology No results found.  Procedures Procedures (including critical care time)  Medications Ordered in ED Medications - No data to display   Initial Impression / Assessment and Plan / ED Course  I have reviewed the triage vital signs and the nursing notes.      Exam c/w folliculitis in beard area. He did have 1 area of firm swelling on L neck, bedside ultrasound shows no fluid collection therefore I suspect reactionary lymph node. He also had other enlarged lymph nodes on neck. He is afebrile, no h/o diabetes. I unroofed a few pustules to allow drainage. Given multiple areas of involvement, will treat w/ systemic abx. Discussed supportive measures at home.Reviewed return precautions.  Final Clinical Impressions(s) / ED Diagnoses   Final diagnoses:  Folliculitis barbae    New Prescriptions New Prescriptions   CLINDAMYCIN (CLEOCIN) 150 MG CAPSULE    Take 2 capsules (300 mg total) by mouth 4 (four) times daily.     Jamyah Folk, Ambrose Finlandachel Morgan, MD 11/17/16 1130

## 2016-11-18 ENCOUNTER — Telehealth: Payer: Self-pay | Admitting: Medical

## 2016-11-18 NOTE — Telephone Encounter (Signed)
Left message for pt to call. Needs cpe per Vincenza HewsShane.

## 2016-11-18 NOTE — Telephone Encounter (Signed)
Please call to get in for physical.   He was last here 2015, and has had several ED visits in recent years.

## 2016-11-28 ENCOUNTER — Encounter (HOSPITAL_BASED_OUTPATIENT_CLINIC_OR_DEPARTMENT_OTHER): Payer: Self-pay | Admitting: *Deleted

## 2016-11-28 ENCOUNTER — Emergency Department (HOSPITAL_BASED_OUTPATIENT_CLINIC_OR_DEPARTMENT_OTHER)
Admission: EM | Admit: 2016-11-28 | Discharge: 2016-11-28 | Disposition: A | Discharge status: Home / Self Care | Payer: Self-pay | Attending: Emergency Medicine | Admitting: Emergency Medicine

## 2016-11-28 DIAGNOSIS — Z760 Encounter for issue of repeat prescription: Secondary | ICD-10-CM | POA: Insufficient documentation

## 2016-11-28 DIAGNOSIS — L0211 Cutaneous abscess of neck: Secondary | ICD-10-CM | POA: Insufficient documentation

## 2016-11-28 DIAGNOSIS — L0291 Cutaneous abscess, unspecified: Secondary | ICD-10-CM

## 2016-11-28 DIAGNOSIS — L739 Follicular disorder, unspecified: Secondary | ICD-10-CM | POA: Insufficient documentation

## 2016-11-28 DIAGNOSIS — F17200 Nicotine dependence, unspecified, uncomplicated: Secondary | ICD-10-CM | POA: Insufficient documentation

## 2016-11-28 MED ORDER — LIDOCAINE-EPINEPHRINE (PF) 2 %-1:200000 IJ SOLN: 10.0000 mL | Freq: Once | INTRAMUSCULAR | Status: DC

## 2016-11-28 MED ORDER — LIDOCAINE-EPINEPHRINE 2 %-1:100000 IJ SOLN
5.0000 mL | Freq: Once | INTRAMUSCULAR | Status: AC
  Administered 2016-11-28: 5 mL

## 2016-11-28 MED ORDER — DOXYCYCLINE HYCLATE 100 MG PO CAPS: 100.0000 mg | ORAL_CAPSULE | Freq: Two times a day (BID) | ORAL | 0 refills | Status: DC

## 2016-11-28 MED ORDER — TRIAMCINOLONE ACETONIDE 0.1 % EX CREA: 1.0000 "application " | TOPICAL_CREAM | Freq: Two times a day (BID) | CUTANEOUS | 0 refills | Status: DC

## 2016-11-28 MED FILL — DOXYCYCLINE HYCLATE 100 MG: 100 | 10 days supply | Qty: 20 | Fill #0

## 2016-11-28 MED FILL — TRIAMCINOLONE 0.1% CREAM: 0.1 | 20 days supply | Qty: 45 | Fill #0

## 2016-11-28 NOTE — Discharge Instructions (Signed)
Take antibiotics as prescribed.  Make sure you take the entire course of antibiotics, even if your symptoms improve. Use Tylenol or ibuprofen as needed for pain. Keep the dressing on for the next 24 hours.  After this, you may remove dressing, washing with soap and water, and reapply new dressing.  Make sure it stays covered until it is no longer bleeding or draining. If the lesions come back after finishing antibiotics, you should follow-up with dermatology. Do not use the Kenalog cream until you are finished with the antibiotics. Return to the emergency room if you develop fevers, chills, vomiting, or any new or worsening symptoms.

## 2016-11-28 NOTE — ED Provider Notes (Signed)
[facility] EMERGENCY DEPARTMENT Provider Note   CSN: [id number] Arrival date & time: 11/28/16  1105     History   Chief Complaint Chief Complaint  Patient presents with  . Medication Refill    HPI Vincent Cook is a 51 y.o. male with lesion on right side neck and recurrence of pustules.  He was seen 2 weeks ago for the same.  He had multiple areas drained, and was placed on clindamycin.  This improved his folliculitis, but yesterday, he started to have recurrence of pustules.  None have drained since the appearance yesterday.  He denies fevers, chills, nausea, vomiting.  He denies immunocompromised status.  He denies history of similar in the past.  His last tetanus was 3 years ago.  He is not on blood thinners, has no other medical problems.  He has a PCP, but currently does not have insurance, and states that he cannot follow-up with them due to cost.  He has never had a bad reaction to antibiotics in the past. Additionally, patient requesting medication refill of Kenalog cream for rosacea.   HPI  Past Medical History:  Diagnosis Date  . Pneumonia 2008   , 3 day hospitalization    There are no active problems to display for this patient.   Past Surgical History:  Procedure Laterality Date  . KNEE SURGERY     left, ligament repair, Dr. Priscille Kluver  . TONSILLECTOMY         Home Medications    Prior to Admission medications   Medication Sig Start Date End Date Taking? Authorizing Provider  amoxicillin-clavulanate (AUGMENTIN) 875-125 MG per tablet Take 1 tablet by mouth 2 (two) times daily. 03/30/14   Shaune Pollack, MD  azithromycin (ZITHROMAX) 250 MG tablet Take 1 tablet (250 mg total) by mouth daily. Take first 2 tablets together, then 1 every day until finished. Patient not taking: Reported on 03/30/2014 02/10/14   Rodolph Bong, MD  doxycycline (VIBRAMYCIN) 100 MG capsule Take 1 capsule (100 mg total) by mouth 2 (two) times daily. 11/28/16    Ayriel Texidor, PA-C  HYDROcodone-acetaminophen (NORCO/VICODIN) 5-325 MG per tablet Take 0.5 tablets by mouth at bedtime as needed (cough). Patient not taking: Reported on 03/30/2014 02/07/14   Rodolph Bong, MD  ibuprofen (ADVIL,MOTRIN) 600 MG tablet Take 1 tablet (600 mg total) by mouth every 8 (eight) hours as needed. 03/30/14   Shaune Pollack, MD  oxyCODONE-acetaminophen (PERCOCET/ROXICET) 5-325 MG per tablet Take 2 tablets by mouth every 4 (four) hours as needed for severe pain. 03/30/14   Shaune Pollack, MD  predniSONE (DELTASONE) 10 MG tablet Take 3 tablets (30 mg total) by mouth daily. Patient not taking: Reported on 03/30/2014 02/07/14   Rodolph Bong, MD  sildenafil (REVATIO) 20 MG tablet Take 1 tablet (20 mg total) by mouth daily as needed. 05/04/14   Tysinger, Kermit Balo, PA-C  sildenafil (VIAGRA) 50 MG tablet 1/2-1 tablet po prn, max 1 daily Patient not taking: Reported on 03/30/2014 07/02/13   Tysinger, Kermit Balo, PA-C  triamcinolone cream (KENALOG) 0.1 % Apply 1 application topically 2 (two) times daily. 11/28/16   Monterio Bob, PA-C    Family History Family History  Problem Relation Age of Onset  . Diabetes Mother   . Diabetes Brother   . Cancer Maternal Uncle        prostate  . Heart disease Neg Hx   . Hypertension Neg Hx   . Stroke Neg Hx     Social  History Social History  Substance Use Topics  . Smoking status: Current Every Day Smoker    Packs/day: 0.50    Years: 1.00  . Smokeless tobacco: Never Used  . Alcohol use 7.2 oz/week    12 Cans of beer per week     Allergies   Patient has no known allergies.   Review of Systems Review of Systems  Constitutional: Negative for chills and fever.  Skin:       Lesions on face  Allergic/Immunologic: Negative for immunocompromised state.  Hematological: Does not bruise/bleed easily.     Physical Exam Updated Vital Signs BP 106/84   Pulse 65   Temp 98.2 F (36.8 C) (Oral)   Resp 20   Ht 5\' 11"  (1.803 m)   Wt 97.1  kg (214 lb)   SpO2 98%   BMI 29.85 kg/m   Physical Exam  Constitutional: He is oriented to person, place, and time. He appears well-developed and well-nourished. No distress.  HENT:  Head: Normocephalic and atraumatic.  Eyes: EOM are normal.  Neck: Normal range of motion.  Cardiovascular: Normal rate, regular rhythm and intact distal pulses.   Pulmonary/Chest: Effort normal and breath sounds normal. No respiratory distress.  Abdominal: He exhibits no distension.  Musculoskeletal: Normal range of motion.  Neurological: He is alert and oriented to person, place, and time.  Skin: Skin is warm. No rash noted.  Lesion at right angle of the mandible that is fluctuant with obvious head.  No active drainage.  Small folliculitis pustular lesions throughout the beard.  No other lesions noted elsewhere.  No surrounding skin erythema or warmth.  Doubt cellulitis.   Psychiatric: He has a normal mood and affect.  Nursing note and vitals reviewed.    ED Treatments / Results  Labs (all labs ordered are listed, but only abnormal results are displayed) Labs Reviewed - No data to display  EKG  EKG Interpretation None       Radiology No results found.  Procedures .Marland KitchenIncision and Drainage Date/Time: 11/28/2016 12:19 PM Performed by: Alveria Apley Authorized by: Alveria Apley   Consent:    Consent obtained:  Verbal   Consent given by:  Patient   Risks discussed:  Bleeding, incomplete drainage, infection and pain   Alternatives discussed:  No treatment Location:    Type:  Abscess   Location:  Neck   Neck location:  R anterior Pre-procedure details:    Skin preparation:  Betadine Anesthesia (see MAR for exact dosages):    Anesthesia method:  Local infiltration   Local anesthetic:  Lidocaine 2% WITH epi Procedure type:    Complexity:  Simple Procedure details:    Incision types:  Single straight   Incision depth:  Dermal   Scalpel blade:  11   Wound management:  Probed  and deloculated and irrigated with saline   Drainage:  Purulent   Drainage amount:  Scant   Wound treatment:  Wound left open   Packing materials:  None Post-procedure details:    Patient tolerance of procedure:  Tolerated well, no immediate complications   (including critical care time)  Medications Ordered in ED Medications  lidocaine-EPINEPHrine (XYLOCAINE W/EPI) 2 %-1:100000 (with pres) injection 5 mL (5 mLs Infiltration Given 11/28/16 1209)     Initial Impression / Assessment and Plan / ED Course  I have reviewed the triage vital signs and the nursing notes.  Pertinent labs & imaging results that were available during my care of the patient were reviewed by me  and considered in my medical decision making (see chart for details).     Patient presenting with abscess of right sided jaw and folliculitis.  No signs of systemic infection or cellulitis.  I&D of abscess performed and purulent material expressed.  Discussed aftercare instructions.  Will place patient on doxycycline.  Patient to follow-up with dermatology if symptoms recur.  Discussed that Kenalog cream can increase risk of bacterial infections.  Patient to not use Kenalog cream until follicular symptoms have resolved.  At this time, patient appears safe for discharge.  Return precautions given.  Patient states he understands and agrees to plan.  Final Clinical Impressions(s) / ED Diagnoses   Final diagnoses:  Folliculitis  Abscess    New Prescriptions Discharge Medication List as of 11/28/2016 12:18 PM    START taking these medications   Details  doxycycline (VIBRAMYCIN) 100 MG capsule Take 1 capsule (100 mg total) by mouth 2 (two) times daily., Starting Thu 11/28/2016, Print         Argyleaccavale, MarienvilleSophia, PA-C 11/28/16 1735    Maia PlanLong, Joshua G, MD 11/29/16 1531

## 2016-11-28 NOTE — ED Notes (Signed)
ED Provider at bedside. 

## 2016-11-28 NOTE — ED Triage Notes (Signed)
States he has a hx of Folliculitis and request a refill on his Clindamycin.

## 2016-12-24 ENCOUNTER — Encounter (HOSPITAL_COMMUNITY): Payer: Self-pay

## 2016-12-24 ENCOUNTER — Other Ambulatory Visit: Payer: Self-pay

## 2016-12-24 ENCOUNTER — Emergency Department (HOSPITAL_COMMUNITY)
Admission: EM | Admit: 2016-12-24 | Discharge: 2016-12-24 | Disposition: A | Discharge status: Home / Self Care | Payer: Self-pay | Attending: Emergency Medicine | Admitting: Emergency Medicine

## 2016-12-24 DIAGNOSIS — L739 Follicular disorder, unspecified: Secondary | ICD-10-CM | POA: Insufficient documentation

## 2016-12-24 DIAGNOSIS — Z79899 Other long term (current) drug therapy: Secondary | ICD-10-CM | POA: Insufficient documentation

## 2016-12-24 DIAGNOSIS — Z87891 Personal history of nicotine dependence: Secondary | ICD-10-CM | POA: Insufficient documentation

## 2016-12-24 MED ORDER — LIDOCAINE-EPINEPHRINE (PF) 2 %-1:200000 IJ SOLN
10.0000 mL | Freq: Once | INTRAMUSCULAR | Status: AC
  Administered 2016-12-24: 10 mL via INTRADERMAL
  Filled 2016-12-24: qty 20

## 2016-12-24 MED ORDER — SULFAMETHOXAZOLE-TRIMETHOPRIM 800-160 MG PO TABS: 1.0000 | ORAL_TABLET | Freq: Two times a day (BID) | ORAL | 0 refills | Status: DC

## 2016-12-24 NOTE — ED Provider Notes (Signed)
[facility]   CSN: [id number] Arrival date & time: 12/24/16  1510     History   Chief Complaint Chief Complaint  Patient presents with  . Insect Bite    HPI Vincent Cook is a 51 y.o. male.  HPI  Mr. 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  Patient has been seen in the emergency department for this complaint several times. Has had multiple areas drained and was placed on clindamycin about a month ago. He improved but then had recurrence in which he came to the ER and was treated with I&D and doxycycline. He states that his symptoms resolved following doxycycline but over the past two days has felt that the boils are returning on his neck and one on the forehead. States that pain is moderate in severity and describes pain as "burning."  He denies fever, drainage, headache, nausea/vomiting, or abdominal pain.  Denies history of HIV or chronic steroid use.  Denies new skin products or medications.  He previously had a PCP, but does not currently have insurance so he has not followed up due to cost.  Denies allergies to antibiotics.  Past Medical History:  Diagnosis Date  . Pneumonia 2008   [place], 3 day hospitalization    There are no active problems to display for this patient.   Past Surgical History:  Procedure Laterality Date  . KNEE SURGERY     left, ligament repair, Dr. Priscille Kluverendall  . TONSILLECTOMY         Home Medications    Prior to Admission medications   Medication Sig Start Date End Date Taking? Authorizing Provider  amoxicillin-clavulanate (AUGMENTIN) 875-125 MG per tablet Take 1 tablet by mouth 2 (two) times daily. 03/30/14   Shaune PollackIsaacs, Cameron, MD  azithromycin (ZITHROMAX) 250 MG tablet Take 1 tablet (250 mg total) by mouth daily. Take first 2 tablets  together, then 1 every day until finished. Patient not taking: Reported on 03/30/2014 02/10/14   Rodolph Bongorey, Evan S, MD  doxycycline (VIBRAMYCIN) 100 MG capsule Take 1 capsule (100 mg total) by mouth 2 (two) times daily. 11/28/16   Caccavale, Sophia, PA-C  HYDROcodone-acetaminophen (NORCO/VICODIN) 5-325 MG per tablet Take 0.5 tablets by mouth at bedtime as needed (cough). Patient not taking: Reported on 03/30/2014 02/07/14   Rodolph Bongorey, Evan S, MD  ibuprofen (ADVIL,MOTRIN) 600 MG tablet Take 1 tablet (600 mg total) by mouth every 8 (eight) hours as needed. 03/30/14   Shaune PollackIsaacs, Cameron, MD  oxyCODONE-acetaminophen (PERCOCET/ROXICET) 5-325 MG per tablet Take 2 tablets by mouth every 4 (four) hours as needed for severe pain. 03/30/14   Shaune PollackIsaacs, Cameron, MD  predniSONE (DELTASONE) 10 MG tablet Take 3 tablets (30 mg total) by mouth daily. Patient not taking: Reported on 03/30/2014 02/07/14   Rodolph Bongorey, Evan S, MD  sildenafil (REVATIO) 20 MG tablet Take 1 tablet (20 mg total) by mouth daily as needed. 05/04/14   Tysinger, Kermit Baloavid S, PA-C  sildenafil (VIAGRA) 50 MG tablet 1/2-1 tablet po prn, max 1 daily Patient not taking: Reported on 03/30/2014 07/02/13   Tysinger, Kermit Baloavid S, PA-C  triamcinolone cream (KENALOG) 0.1 % Apply 1 application topically 2 (two) times daily. 11/28/16   Caccavale, Sophia, PA-C    Family History Family History  Problem Relation Age of Onset  . Diabetes Mother   . Diabetes Brother   .  Cancer Maternal Uncle        prostate  . Heart disease Neg Hx   . Hypertension Neg Hx   . Stroke Neg Hx     Social History Social History   Tobacco Use  . Smoking status: Former Smoker    Packs/day: 0.00    Last attempt to quit: 02/25/2016    Years since quitting: 0.8  . Smokeless tobacco: Never Used  Substance Use Topics  . Alcohol use: Yes    Alcohol/week: 7.2 oz    Types: 12 Cans of beer per week  . Drug use: No     Allergies   Patient has no known allergies.   Review of Systems Review of Systems    Constitutional: Negative for chills, fatigue and fever.  Gastrointestinal: Negative for abdominal pain, nausea and vomiting.  Musculoskeletal: Positive for neck pain (over the pustules).  Skin: Positive for color change (erythematous pustules on the face). Negative for rash and wound.  Neurological: Negative for headaches.     Physical Exam Updated Vital Signs BP 127/75 (BP Location: Right Arm)   Pulse 73   Temp 98.1 F (36.7 C) (Oral)   Resp 16   Ht 5\' 11"  (1.803 m)   Wt 97.1 kg (214 lb)   SpO2 100%   BMI 29.85 kg/m   Physical Exam  Constitutional: He appears well-developed and well-nourished. No distress.  HENT:  Head: Normocephalic and atraumatic.  Mouth/Throat: Oropharynx is clear and moist. No oropharyngeal exudate.  One erythematous pustule about 1cm in diameter on the left forehead. Mild overlying tenderness. No induration or fluctuance. No drainage.  Mucous membranes moist, uvula midline.  No trismus.  Airway patent.  Eyes: Conjunctivae are normal. Pupils are equal, round, and reactive to light. Right eye exhibits no discharge. Left eye exhibits no discharge.  Neck: Normal range of motion. Neck supple.  Several erythematous pustules located along the jaw line which are tender to the touch. No active drainage. No induration. See picture below.   Pulmonary/Chest: Effort normal. No respiratory distress.  Neurological: He is alert. Coordination normal.  Skin: He is not diaphoretic.  Psychiatric: He has a normal mood and affect. His behavior is normal.  Nursing note and vitals reviewed.          ED Treatments / Results  Labs (all labs ordered are listed, but only abnormal results are displayed) Labs Reviewed - No data to display  EKG  EKG Interpretation None       Radiology No results found.  Procedures Procedures (including critical care time)  EMERGENCY DEPARTMENT US SOFT TISSUE INTERPRETATION "Study: Limited Soft Tissue  Ultrasound"  INDICATIONS: Soft tissue infection Multiple views of the body part were obtained in real-time with a multi-frequency linear probe  PERFORMED BY: Myself IMAGES ARCHIVED?: Yes SIDE:Left and Right  BODY PART:Neck INTERPRETATION:  Abcess present   Medications Ordered in ED Medications  lidocaine-EPINEPHrine (XYLOCAINE W/EPI) 2 %-1:200000 (PF) injection 10 mL (not administered)     Initial Impression / Assessment and Plan / ED Course  I have reviewed the triage vital signs and the nursing notes.  Pertinent labs & imaging results that were available during my care of the patient were reviewed by me and considered in my medical decision making (see chart for details).     Patient with a history of recurrent folliculitis presents with several isolated pustules on the neck and face which have developed over the past two days. Patient is afebrile, nontoxic appearing and in no  acute distress. Bedside soft tissue ultrasound performed which shows one pustule that appears to be amenable to I&D. I&D performed with expression of purulent drainage. Abscess was not large enough to warrant packing or drain,  wound recheck in 2 days. Encouraged home warm soaks and flushing. Given multiple areas of involvement, will treat with systemic antibiotics. Have given patient information to follow up with dermatology given several recurrent episodes in the past month. Discussed return precautions and patient agrees and voices understanding to the above plan.    Final Clinical Impressions(s) / ED Diagnoses   Final diagnoses:  Folliculitis    ED Discharge Orders        Ordered    sulfamethoxazole-trimethoprim (BACTRIM DS,SEPTRA DS) 800-160 MG tablet  2 times daily     12/24/16 1932       Lawrence Marseilles 12/24/16 Florene Glen, MD 12/25/16 1311

## 2016-12-24 NOTE — Discharge Instructions (Addendum)
Please take antibiotic twice a day for the next 7 days.  Gently wash the site of the incision with warm soapy water daily.  It may drain for the next several days, that is normal.  Please schedule an appointment to follow-up with dermatology regarding her recurrent face infections. I have listed dermatologist (Dr. Aryiah Monterosso FilbertGould) below.   Return to the emergency department if you have fever greater than 100.4 F, worsening redness and swelling in the neck or have any new or worsening symptoms.

## 2016-12-24 NOTE — ED Triage Notes (Addendum)
Pt reports spider bite 1 month ago resulting in "boils" to his neck and face. Pt reports receiving doxycycline for it and they went away. Pt finished the antibiotics 2 weeks ago and reports the boils have come back. Pt denies difficulty swallowing or breathing. Pt denies N/V/D. Pt reports the boils tender to touch rated 8/10.  Pt reports coming in today because they are "more sore" than they normally are.

## 2016-12-24 NOTE — ED Notes (Signed)
Called for triage, no response.

## 2016-12-24 NOTE — ED Notes (Signed)
ED Provider at bedside. 

## 2017-07-21 ENCOUNTER — Emergency Department (HOSPITAL_BASED_OUTPATIENT_CLINIC_OR_DEPARTMENT_OTHER): Payer: Self-pay

## 2017-07-21 ENCOUNTER — Encounter (HOSPITAL_BASED_OUTPATIENT_CLINIC_OR_DEPARTMENT_OTHER): Payer: Self-pay | Admitting: *Deleted

## 2017-07-21 ENCOUNTER — Emergency Department (HOSPITAL_BASED_OUTPATIENT_CLINIC_OR_DEPARTMENT_OTHER)
Admission: EM | Admit: 2017-07-21 | Discharge: 2017-07-21 | Disposition: A | Discharge status: Home / Self Care | Payer: Self-pay | Attending: Emergency Medicine | Admitting: Emergency Medicine

## 2017-07-21 ENCOUNTER — Other Ambulatory Visit: Payer: Self-pay

## 2017-07-21 DIAGNOSIS — Z87891 Personal history of nicotine dependence: Secondary | ICD-10-CM | POA: Insufficient documentation

## 2017-07-21 DIAGNOSIS — M25562 Pain in left knee: Secondary | ICD-10-CM | POA: Insufficient documentation

## 2017-07-21 NOTE — ED Triage Notes (Signed)
Pt c/o left knee pain  x 1 day denies injury

## 2017-07-21 NOTE — ED Provider Notes (Signed)
Medical screening examination/treatment/procedure(s) were conducted as a shared visit with non-physician practitioner(s) and myself.  I personally evaluated the patient during the encounter.  None States he was working and hyperextended his knee while carrying heavy box down some stairs.  At the time he reports it was not terribly painful but overnight it became much more swollen and painful.  Patient is alert and nontoxic.  Mild effusion of the left knee.  No erythema or warmth.  Range of motion intact but discomfort with deep flexion.  At this time findings are consistent with mild overuse\hyperextension injury.  Agree with conservative management.  Have reviewed return precautions.   Arby BarrettePfeiffer, Carra Brindley, MD 07/23/17 [id number]

## 2017-07-21 NOTE — ED Notes (Signed)
Patient transported to X-ray 

## 2017-07-21 NOTE — Discharge Instructions (Addendum)

## 2017-07-21 NOTE — ED Provider Notes (Signed)
[facility] EMERGENCY DEPARTMENT Provider Note   CSN: [id number] Arrival date & time: 07/21/17  1719     History   Chief Complaint Chief Complaint  Patient presents with  . Knee Pain    HPI Vincent Cook is a 52 y.o. male with no significant past medical history who presents today for evaluation of left-sided knee pain.  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  He went to sleep and then when he woke up his knee hurt more.  He denies fevers, chills, no body aches or systemic symptoms.  He reports his left knee is swollen, and red.  HPI  Past Medical History:  Diagnosis Date  . Pneumonia 2008   [place], 3 day hospitalization    There are no active problems to display for this patient.   Past Surgical History:  Procedure Laterality Date  . KNEE SURGERY     left, ligament repair, Dr. Priscille Kluverendall  . TONSILLECTOMY          Home Medications    Prior to Admission medications   Medication Sig Start Date End Date Taking? Authorizing Provider  ibuprofen (ADVIL,MOTRIN) 600 MG tablet Take 1 tablet (600 mg total) by mouth every 8 (eight) hours as needed. 03/30/14   Shaune PollackIsaacs, Cameron, MD  sildenafil (REVATIO) 20 MG tablet Take 1 tablet (20 mg total) by mouth daily as needed. 05/04/14   Tysinger, Kermit Baloavid S, PA-C  sildenafil (VIAGRA) 50 MG tablet 1/2-1 tablet po prn, max 1 daily Patient not taking: Reported on 03/30/2014 07/02/13   Tysinger, Kermit Baloavid S, PA-C    Family History Family History  Problem Relation Age of Onset  . Diabetes Mother   . Diabetes Brother   . Cancer Maternal Uncle        prostate  . Heart disease Neg Hx   . Hypertension Neg Hx   . Stroke Neg Hx     Social History Social History   Tobacco Use  . Smoking status: Former Smoker    Packs/day: 0.00    Last attempt to quit: 02/25/2016    Years since quitting: 1.4  . Smokeless tobacco: Never Used    Substance Use Topics  . Alcohol use: Yes    Alcohol/week: 7.2 oz    Types: 12 Cans of beer per week  . Drug use: No     Allergies   Patient has no known allergies.   Review of Systems Review of Systems  Constitutional: Negative for chills and fever.  Musculoskeletal: Positive for joint swelling.       Left knee pain  Skin: Positive for color change (Redness over left knee).  All other systems reviewed and are negative.    Physical Exam Updated Vital Signs BP 124/83 (BP Location: Left Arm)   Pulse 69   Temp 98.5 F (36.9 C) (Oral)   Resp 18   Ht 5\' 11"  (1.803 m)   Wt 98.4 kg (217 lb)   SpO2 99%   BMI 30.27 kg/m   Physical Exam  Constitutional: He appears well-developed and well-nourished.  HENT:  Head: Normocephalic.  Cardiovascular: Intact distal pulses.  Musculoskeletal:  There is swelling over the left knee.  Range of motion in left knee decreased when compared to right knee, however patient is able to bend his knee some.  Knee is grossly stable on my exam.  Neurological: He is alert.  Skin: Skin is warm and dry. He is not diaphoretic.  Skin over left knee is not obviously red.  There are no skin breaks over left knee.  Left knee is not clearly warmer to touch than right knee.    Nursing note and vitals reviewed.    ED Treatments / Results  Labs (all labs ordered are listed, but only abnormal results are displayed) Labs Reviewed - No data to display  EKG None  Radiology Dg Knee Complete 4 Views Left  Result Date: 07/21/2017 CLINICAL DATA:  Left knee pain without known injury x1 day. History of prior surgeries to "clean out cartilage" per patient. EXAM: LEFT KNEE - COMPLETE 4+ VIEW COMPARISON:  None. 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 No acute fracture, intra-articular loose body nor suspicious osseous lesions. Small suprapatellar joint effusion is identified. Minimal spurring about the tibial  spines. IMPRESSION: Chondrocalcinosis with slight femorotibial joint space narrowing likely degenerative in etiology. No acute osseous abnormality. Small suprapatellar joint effusion. Electronically Signed   By: Tollie Eth M.D.   On: 07/21/2017 17:48    Procedures Procedures (including critical care time)  Medications Ordered in ED Medications - No data to display   Initial Impression / Assessment and Plan / ED Course  I have reviewed the triage vital signs and the nursing notes.  Pertinent labs & imaging results that were available during my care of the patient were reviewed by me and considered in my medical decision making (see chart for details).    Pt with mild swelling to the joint spaces, knee swelling, tightness in the knee, and mildly restricted range of motion. Pt unable to perform full flexion of the knee.  Pt is without systemic symptoms, erythema or redness of the joint consistent with gout or septic joint.  Patient X-Ray negative for obvious fracture or dislocation. Pain managed in ED. Pt advised to follow up with orthopedics if symptoms persist for further evaluation and treatment. Patient given ACE wrap while in ED, conservative therapy recommended and discussed. Patient will be dc home & is agreeable with above plan.   Final Clinical Impressions(s) / ED Diagnoses   Final diagnoses:  Acute pain of left knee    ED Discharge Orders    None       Norman Clay 07/21/17 1912    Arby Barrette, MD 07/23/17 [phone]

## 2017-07-23 ENCOUNTER — Encounter (HOSPITAL_BASED_OUTPATIENT_CLINIC_OR_DEPARTMENT_OTHER): Payer: Self-pay

## 2017-07-23 ENCOUNTER — Emergency Department (HOSPITAL_BASED_OUTPATIENT_CLINIC_OR_DEPARTMENT_OTHER)
Admission: EM | Admit: 2017-07-23 | Discharge: 2017-07-23 | Disposition: A | Discharge status: Home / Self Care | Payer: Self-pay | Attending: Emergency Medicine | Admitting: Emergency Medicine

## 2017-07-23 ENCOUNTER — Other Ambulatory Visit: Payer: Self-pay

## 2017-07-23 DIAGNOSIS — M25562 Pain in left knee: Secondary | ICD-10-CM | POA: Insufficient documentation

## 2017-07-23 DIAGNOSIS — Z87891 Personal history of nicotine dependence: Secondary | ICD-10-CM | POA: Insufficient documentation

## 2017-07-23 DIAGNOSIS — R609 Edema, unspecified: Secondary | ICD-10-CM | POA: Insufficient documentation

## 2017-07-23 DIAGNOSIS — Z79899 Other long term (current) drug therapy: Secondary | ICD-10-CM | POA: Insufficient documentation

## 2017-07-23 NOTE — ED Provider Notes (Addendum)
[facility] EMERGENCY DEPARTMENT Provider Note   CSN: [id number] Arrival date & time: 07/23/17  1353     History   Chief Complaint Chief Complaint  Patient presents with  . Knee Pain    HPI Vincent Cook is a 52 y.o. male.  52 yo M with left knee pain.  This got worse over the past week seen in the ED with x-ray.  Since then the pain is not significantly improved.  He has been using an Ace wrap.  No injury since his visit.  No fevers.  The history is provided by the patient.  Knee Pain   This is a recurrent problem. The current episode started more than 1 week ago. The problem occurs constantly. The problem has been gradually worsening. The pain is present in the left knee. The quality of the pain is described as sharp and aching. The pain is at a severity of 4/10. The pain is moderate. He has tried nothing for the symptoms. The treatment provided no relief. There has been no history of extremity trauma.    Past Medical History:  Diagnosis Date  . Pneumonia 2008   [place], 3 day hospitalization    There are no active problems to display for this patient.   Past Surgical History:  Procedure Laterality Date  . KNEE SURGERY     left, ligament repair, Dr. Priscille Kluverendall  . TONSILLECTOMY          Home Medications    Prior to Admission medications   Medication Sig Start Date End Date Taking? Authorizing Provider  ibuprofen (ADVIL,MOTRIN) 600 MG tablet Take 1 tablet (600 mg total) by mouth every 8 (eight) hours as needed. 03/30/14   Shaune PollackIsaacs, Cameron, MD  sildenafil (REVATIO) 20 MG tablet Take 1 tablet (20 mg total) by mouth daily as needed. 05/04/14   Tysinger, Kermit Baloavid S, PA-C  sildenafil (VIAGRA) 50 MG tablet 1/2-1 tablet po prn, max 1 daily Patient not taking: Reported on 03/30/2014 07/02/13   Tysinger, Kermit Baloavid S, PA-C    Family History Family History  Problem Relation Age of Onset  . Diabetes Mother   . Diabetes Brother   . Cancer Maternal Uncle        prostate    . Heart disease Neg Hx   . Hypertension Neg Hx   . Stroke Neg Hx     Social History Social History   Tobacco Use  . Smoking status: Former Smoker    Packs/day: 0.00    Last attempt to quit: 02/25/2016    Years since quitting: 1.4  . Smokeless tobacco: Never Used  Substance Use Topics  . Alcohol use: Yes    Alcohol/week: 7.2 oz    Types: 12 Cans of beer per week  . Drug use: No     Allergies   Patient has no known allergies.   Review of Systems Review of Systems  Constitutional: Negative for chills and fever.  HENT: Negative for congestion and facial swelling.   Eyes: Negative for discharge and visual disturbance.  Respiratory: Negative for shortness of breath.   Cardiovascular: Negative for chest pain and palpitations.  Gastrointestinal: Negative for abdominal pain, diarrhea and vomiting.  Musculoskeletal: Positive for arthralgias and gait problem. Negative for myalgias.  Skin: Negative for color change and rash.  Neurological: Negative for tremors, syncope and headaches.  Psychiatric/Behavioral: Negative for confusion and dysphoric mood.     Physical Exam Updated Vital Signs BP 123/66 (BP Location: Right Arm)   Pulse 76  Temp 98.6 F (37 C) (Oral)   Resp 18   Ht 5\' 7"  (1.702 m)   Wt 97.1 kg (214 lb)   SpO2 97%   BMI 33.52 kg/m   Physical Exam  Constitutional: He is oriented to person, place, and time. He appears well-developed and well-nourished.  HENT:  Head: Normocephalic and atraumatic.  Eyes: Pupils are equal, round, and reactive to light. EOM are normal.  Neck: Normal range of motion. Neck supple. No JVD present.  Cardiovascular: Normal rate and regular rhythm. Exam reveals no gallop and no friction rub.  No murmur heard. Pulmonary/Chest: No respiratory distress. He has no wheezes.  Abdominal: He exhibits no distension. There is no rebound and no guarding.  Musculoskeletal: Normal range of motion. He exhibits edema. He exhibits no tenderness.   Small joint effusion, no erythema full range of motion no ligamentous laxity  Neurological: He is alert and oriented to person, place, and time.  Skin: No rash noted. No pallor.  Psychiatric: He has a normal mood and affect. His behavior is normal.  Nursing note and vitals reviewed.    ED Treatments / Results  Labs (all labs ordered are listed, but only abnormal results are displayed) Labs Reviewed - No data to display  EKG None  Radiology Dg Knee Complete 4 Views Left  Result Date: 07/21/2017 CLINICAL DATA:  Left knee pain without known injury x1 day. History of prior surgeries to "clean out cartilage" per patient. EXAM: LEFT KNEE - COMPLETE 4+ VIEW COMPARISON:  None. 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 No acute fracture, intra-articular loose body nor suspicious osseous lesions. Small suprapatellar joint effusion is identified. Minimal spurring about the tibial spines. IMPRESSION: Chondrocalcinosis with slight femorotibial joint space narrowing likely degenerative in etiology. No acute osseous abnormality. Small suprapatellar joint effusion. Electronically Signed   By: Tollie Eth M.D.   On: 07/21/2017 17:48    Procedures Procedures (including critical care time)  Medications Ordered in ED Medications - No data to display   Initial Impression / Assessment and Plan / ED Course  I have reviewed the triage vital signs and the nursing notes.  Pertinent labs & imaging results that were available during my care of the patient were reviewed by me and considered in my medical decision making (see chart for details).     52 yo M here for an extended work note.  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  He has been having pain in the left knee was seen in the ED and given a work note but he was supposed to return to work today.  He does not feel that he is able to go back at this time.  I  will write him out until tomorrow and then limited duty for the next week.  I urged him to follow-up with his primary care provider in case they need to file for short-term disability, he was also given follow-up with sports medicine.  3:36 PM:  I have discussed the diagnosis/risks/treatment options with the patient and believe the pt to be eligible for discharge home to follow-up with PCP. We also discussed returning to the ED immediately if new or worsening sx occur. We discussed the sx which are most concerning (e.g., sudden worsening pain, fever, inability to tolerate by mouth) that necessitate immediate return. Medications administered to the patient during their visit and any new prescriptions provided to the patient are listed below.  Medications given during this visit Medications - No data to display    The patient appears reasonably screen and/or stabilized for discharge and I doubt any other medical condition or other [facility] requiring further screening, evaluation, or treatment in the ED at this time prior to discharge.    Final Clinical Impressions(s) / ED Diagnoses   Final diagnoses:  Acute pain of left knee    ED Discharge Orders    None       Melene Plan, DO 07/23/17 1535    Melene Plan, DO 07/23/17 1536

## 2017-07-23 NOTE — ED Triage Notes (Signed)
Pt c/o cont'd pain, swelling to left knee-ace wrap in place-states he was seen here for same-RTW note for tomorrow-states he is no better and needs a RTW note for "Sunday"-NAD-steady gait

## 2017-07-23 NOTE — Discharge Instructions (Signed)
Take 4 over the counter ibuprofen tablets 3 times a day or 2 over-the-counter naproxen tablets twice a day for pain. Also take tylenol 1000mg(2 extra strength) four times a day.    

## 2017-07-23 NOTE — ED Notes (Signed)
Pt states needs work note to return to work

## 2017-12-19 ENCOUNTER — Emergency Department (HOSPITAL_BASED_OUTPATIENT_CLINIC_OR_DEPARTMENT_OTHER)
Admission: EM | Admit: 2017-12-19 | Discharge: 2017-12-19 | Disposition: A | Discharge status: Home / Self Care | Payer: Self-pay | Attending: Emergency Medicine | Admitting: Emergency Medicine

## 2017-12-19 ENCOUNTER — Other Ambulatory Visit: Payer: Self-pay

## 2017-12-19 ENCOUNTER — Encounter (HOSPITAL_BASED_OUTPATIENT_CLINIC_OR_DEPARTMENT_OTHER): Payer: Self-pay | Admitting: Emergency Medicine

## 2017-12-19 ENCOUNTER — Emergency Department (HOSPITAL_BASED_OUTPATIENT_CLINIC_OR_DEPARTMENT_OTHER): Payer: Self-pay

## 2017-12-19 DIAGNOSIS — M25562 Pain in left knee: Secondary | ICD-10-CM | POA: Insufficient documentation

## 2017-12-19 DIAGNOSIS — Z87891 Personal history of nicotine dependence: Secondary | ICD-10-CM | POA: Insufficient documentation

## 2017-12-19 DIAGNOSIS — Z79899 Other long term (current) drug therapy: Secondary | ICD-10-CM | POA: Insufficient documentation

## 2017-12-19 DIAGNOSIS — G8929 Other chronic pain: Secondary | ICD-10-CM

## 2017-12-19 NOTE — ED Provider Notes (Signed)
[facility] EMERGENCY DEPARTMENT Provider Note   CSN: [id number] Arrival date & time: 12/19/17  1651     History   Chief Complaint Chief Complaint  Patient presents with  . Joint Swelling    HPI Vincent Cook is a 52 y.o. male presenting for concern of left knee pain and swelling.  Patient states that he has had intermittent pain and swelling to his left knee for couple years.  Patient denies injury or trauma to the area.  Patient states that when he woke up this morning his left knee slightly more swollen than normal with increased pain.  Patient describes pain as throbbing and moderate in intensity, constant and worse with ambulation.  Patient has not taken anything for pain prior to arrival.  Patient denies fever, chills, color change, trauma/injury or wound or change to his normal pain.  Nursing note mentions history of gout, patient denies history of gout.  Patient states that his knee surgery was greater than 20 years ago and is unsure of reason for surgery.  Patient denies recent surgery or injection of the knee.  HPI  Past Medical History:  Diagnosis Date  . Pneumonia 2008   [place], 3 day hospitalization    There are no active problems to display for this patient.   Past Surgical History:  Procedure Laterality Date  . KNEE SURGERY     left, ligament repair, Dr. Priscille Kluverendall  . TONSILLECTOMY          Home Medications    Prior to Admission medications   Medication Sig Start Date End Date Taking? Authorizing Provider  ibuprofen (ADVIL,MOTRIN) 600 MG tablet Take 1 tablet (600 mg total) by mouth every 8 (eight) hours as needed. 03/30/14   Shaune PollackIsaacs, Cameron, MD  sildenafil (REVATIO) 20 MG tablet Take 1 tablet (20 mg total) by mouth daily as needed. 05/04/14   Tysinger, Kermit Baloavid S, PA-C  sildenafil (VIAGRA) 50 MG tablet 1/2-1 tablet po prn, max 1 daily Patient not taking: Reported on 03/30/2014 07/02/13   Tysinger, Kermit Baloavid S, PA-C    Family History Family History    Problem Relation Age of Onset  . Diabetes Mother   . Diabetes Brother   . Cancer Maternal Uncle        prostate  . Heart disease Neg Hx   . Hypertension Neg Hx   . Stroke Neg Hx     Social History Social History   Tobacco Use  . Smoking status: Former Smoker    Packs/day: 0.00    Last attempt to quit: 02/25/2016    Years since quitting: 1.8  . Smokeless tobacco: Never Used  Substance Use Topics  . Alcohol use: Yes    Alcohol/week: 12.0 standard drinks    Types: 12 Cans of beer per week  . Drug use: No     Allergies   Patient has no known allergies.   Review of Systems Review of Systems  Constitutional: Negative.  Negative for chills and fever.  Musculoskeletal: Positive for arthralgias (Left knee pain) and joint swelling (Left knee swelling).  Skin: Negative.  Negative for color change and wound.  Neurological: Negative.  Negative for weakness and numbness.   Physical Exam Updated Vital Signs BP 122/77   Pulse 88   Temp 98.3 F (36.8 C) (Oral)   Resp 18   Ht 5\' 11"  (1.803 m)   Wt 95.3 kg   SpO2 100%   BMI 29.29 kg/m   Physical Exam  Constitutional: He is oriented  to person, place, and time. He appears well-developed and well-nourished. No distress.  HENT:  Head: Normocephalic and atraumatic.  Right Ear: External ear normal.  Left Ear: External ear normal.  Nose: Nose normal.  Eyes: Pupils are equal, round, and reactive to light. EOM are normal.  Neck: Trachea normal and normal range of motion. No tracheal deviation present.  Cardiovascular: Normal rate, regular rhythm, normal heart sounds and intact distal pulses.  Pulses:      Dorsalis pedis pulses are 2+ on the right side, and 2+ on the left side.       Posterior tibial pulses are 2+ on the right side, and 2+ on the left side.  Pulmonary/Chest: Effort normal. No respiratory distress.  Abdominal: Soft. There is no tenderness. There is no rebound and no guarding.  Musculoskeletal: Normal range of  motion.       Right hip: Normal.       Left hip: Normal.       Right knee: Normal.       Left knee: He exhibits swelling. He exhibits normal range of motion and no deformity. Tenderness found.       Right ankle: Normal.       Left ankle: Normal.       Right lower leg: Normal.       Left lower leg: Normal.       Right foot: Normal.       Left foot: Normal.  Left Knee: Diffusely swollen without obvious deformity.  No erythema, heat, fluctuance or break of skin.  No tenderness to palpation.  Patient is able to ambulate without assistance or difficulty or increase in pain on my evaluation today. Active and passive flexion and extension intact without crepitus but with some increase in pain.  Negative anterior/poster drawer bilaterally. No varus or valgus laxity or locking. No tenderness to palpation of hips or ankles.Compartments soft. Neurovascularly intact distally to site of injury.  Feet:  Right Foot:  Protective Sensation: 3 sites tested. 3 sites sensed.  Left Foot:  Protective Sensation: 3 sites tested. 3 sites sensed.  Neurological: He is alert and oriented to person, place, and time.  Speech is clear and goal oriented, follows commands Major Cranial nerves without deficit, no facial droop Normal strength in upper and lower extremities bilaterally including dorsiflexion and plantar flexion, strong and equal grip strength Sensation normal to light touch Moves extremities without ataxia, coordination intact Normal gait  Skin: Skin is warm and dry. Capillary refill takes less than 2 seconds.  Psychiatric: He has a normal mood and affect. His behavior is normal.    ED Treatments / Results  Labs (all labs ordered are listed, but only abnormal results are displayed) Labs Reviewed - No data to display  EKG None  Radiology Dg Knee Complete 4 Views Left  Result Date: 12/19/2017 CLINICAL DATA:  Acute onset LEFT knee pain this morning.  No injury. EXAM: LEFT KNEE - COMPLETE 4+ VIEW  COMPARISON:  LEFT knee radiograph July 21, 2017 FINDINGS: Mild medial compartment narrowing with tibial spine peaking. Similar faint intra-articular calcifications. No destructive bony lesions. Mild vascular calcifications. Moderate suprapatellar joint effusion. IMPRESSION: 1. Moderate suprapatellar joint effusion without acute osseous process. 2. Mild medial compartment osteoarthrosis. 3. Chondrocalcinosis. Electronically Signed   By: Awilda Metro M.D.   On: 12/19/2017 19:53    Procedures Procedures (including critical care time)  Medications Ordered in ED Medications - No data to display   Initial Impression / Assessment and Plan /  ED Course  I have reviewed the triage vital signs and the nursing notes.  Pertinent labs & imaging results that were available during my care of the patient were reviewed by me and considered in my medical decision making (see chart for details).    52 year old male presenting today for exacerbation of chronic left knee pain as well as swelling.  Patient without new injury or trauma to the area, states that swelling began this morning.  Patient has not used any medications or techniques to relieve the symptoms prior to arrival.  Patient with distant history of knee surgery greater than 20 years ago, no recent surgery or injections.  Imaging today shows moderate suprapatellar joint effusion without acute osseous process as well as osteoarthrosis and chondrocalcinosis. Patient neurovascular intact to bilateral lower extremities with full range of motion and 5/5 strength.   Pt is without systemic symptoms, erythema or redness of the joint consistent with gout or septic joint.  Patient X-Ray negative for obvious fracture or dislocation. Pt advised to follow up with sports medicine for further evaluation and treatment. Patient given brace while in ED, conservative therapy recommended and discussed. Patient will be dc home & is agreeable with above plan.  Patient  afebrile, not tachycardic, not hypotensive well-appearing in no acute distress.  No signs of neurovascular compromise, infection/septic arthritis, compartment syndrome, DVT or other acute processes today.  At this time there does not appear to be any evidence of an acute emergency medical condition and the patient appears stable for discharge with appropriate outpatient follow up. Diagnosis was discussed with patient who verbalizes understanding of care plan and is agreeable to discharge. I have discussed return precautions with patient who verbalizes understanding of return precautions. Patient strongly encouraged to follow-up with their PCP and sports medicine. All questions answered.  Patient's case discussed with Dr. Rubin Payor who agrees with plan to discharge with follow-up.   Note: Portions of this report may have been transcribed using voice recognition software. Every effort was made to ensure accuracy; however, inadvertent computerized transcription errors may still be present. Final Clinical Impressions(s) / ED Diagnoses   Final diagnoses:  Chronic pain of left knee    ED Discharge Orders    None       Elizabeth Palau 12/19/17 2017    Benjiman Core, MD 12/20/17 [phone]

## 2017-12-19 NOTE — ED Triage Notes (Signed)
Left knee swelling since this morning.  Hx of gout and previous knee surgery on same knee.

## 2017-12-19 NOTE — Discharge Instructions (Signed)
You have been diagnosed today with chronic pain of the left knee.  At this time there does not appear to be the presence of an emergent medical condition, however there is always the potential for conditions to change. Please read and follow the below instructions.  Please return to the Emergency Department immediately for any new or worsening symptoms or if your symptoms do not improve. Please be sure to follow up with your Primary Care Provider within 1 week regarding your visit today; please call their office to schedule an appointment even if you are feeling better for a follow-up visit. Your x-ray today shows fluid collection around your knee as well as arthritis.  Please follow-up with the sports medicine doctor, Dr. Pearletha ForgeHudnall regarding these findings and for further treatment of your chronic left knee pain.  Please call his office to schedule appointment for next week. You may use the knee sleeve provided to you today for comfort, use rest ice and elevation to help with pain and swelling.  You may use over-the-counter anti-inflammatories such as ibuprofen to help with pain.  Get help right away if: Your knee feels warm to the touch. You cannot move your knee. You have severe pain in your knee. You have chest pain. You have trouble breathing. Get help right away if: You have increased swelling or redness of your knee. You have severe pain in your knee. You have a fever.  Please read the additional information packets attached to your discharge summary.  Do not take your medicine if  develop an itchy rash, swelling in your mouth or lips, or difficulty breathing.

## 2017-12-29 ENCOUNTER — Ambulatory Visit (HOSPITAL_COMMUNITY)
Admission: EM | Admit: 2017-12-29 | Discharge: 2017-12-29 | Disposition: A | Discharge status: Home / Self Care | Payer: Self-pay | Attending: Physician Assistant | Admitting: Physician Assistant

## 2017-12-29 ENCOUNTER — Encounter (HOSPITAL_COMMUNITY): Payer: Self-pay | Admitting: Emergency Medicine

## 2017-12-29 DIAGNOSIS — M25562 Pain in left knee: Secondary | ICD-10-CM

## 2017-12-29 DIAGNOSIS — G8929 Other chronic pain: Secondary | ICD-10-CM

## 2017-12-29 DIAGNOSIS — M1712 Unilateral primary osteoarthritis, left knee: Secondary | ICD-10-CM

## 2017-12-29 MED ORDER — MELOXICAM 7.5 MG PO TABS: 15.0000 mg | ORAL_TABLET | Freq: Every day | ORAL | 1 refills | Status: DC

## 2017-12-29 MED ORDER — TRIAMCINOLONE ACETONIDE 40 MG/ML IJ SUSP
INTRAMUSCULAR | Status: AC
  Filled 2017-12-29: qty 2

## 2017-12-29 NOTE — ED Triage Notes (Signed)
Pt here for left knee pain chronic in nature

## 2017-12-29 NOTE — Discharge Instructions (Addendum)
Most like a flare of your osteoarthritis. The shot should help. Please take it east over next few days. Ice and elevate. Take the anti-inflammatory daily as needed for pain. Tylenol can be added as directed if needed.

## 2017-12-29 NOTE — ED Provider Notes (Signed)
[facility]    CSN: [id number] Arrival date & time: 12/29/17  1634     History   Chief Complaint Chief Complaint  Patient presents with  . Knee Pain    HPI Dorene GrebeDarrell Trombly is a 52 y.o. male.   Patient presents with acute on chronic left knee pain. Ongoing now for over a year. He has been seen at multiple urgent cares, but does not have insurance so cannot see Orthopedics until first of year. Complains on ongoing pain and swelling with activity and use. Some giving way. No warmth or erythema. No additional joint pain or swelling. No morning stiffness.      Past Medical History:  Diagnosis Date  . Pneumonia 2008   [place], 3 day hospitalization    There are no active problems to display for this patient.   Past Surgical History:  Procedure Laterality Date  . KNEE SURGERY     left, ligament repair, Dr. Priscille Kluverendall  . TONSILLECTOMY         Home Medications    Prior to Admission medications   Medication Sig Start Date End Date Taking? Authorizing Provider  ibuprofen (ADVIL,MOTRIN) 600 MG tablet Take 1 tablet (600 mg total) by mouth every 8 (eight) hours as needed. 03/30/14   Shaune PollackIsaacs, [place], MD  meloxicam (MOBIC) 7.5 MG tablet Take 2 tablets (15 mg total) by mouth daily. 12/29/17 01/29/18  Riki SheerYoung, Braelin Costlow G, PA-C  sildenafil (REVATIO) 20 MG tablet Take 1 tablet (20 mg total) by mouth daily as needed. 05/04/14   Tysinger, Kermit Baloavid S, PA-C  sildenafil (VIAGRA) 50 MG tablet 1/2-1 tablet po prn, max 1 daily Patient not taking: Reported on 03/30/2014 07/02/13   Tysinger, Kermit Baloavid S, PA-C    Family History Family History  Problem Relation Age of Onset  . Diabetes Mother   . Diabetes Brother   . Cancer Maternal Uncle        prostate  . Heart disease Neg Hx   . Hypertension Neg Hx   . Stroke Neg Hx     Social History Social History   Tobacco Use  . Smoking status: Former Smoker    Packs/day: 0.00    Last attempt to quit: 02/25/2016    Years since quitting: 1.8  .  Smokeless tobacco: Never Used  Substance Use Topics  . Alcohol use: Yes    Alcohol/week: 12.0 standard drinks    Types: 12 Cans of beer per week  . Drug use: No     Allergies   Patient has no known allergies.   Review of Systems Review of Systems  Constitutional: Negative for fever.  Musculoskeletal: Positive for joint swelling.  Skin: Negative for rash.  All other systems reviewed and are negative.    Physical Exam Triage Vital Signs ED Triage Vitals  Enc Vitals Group     BP 12/29/17 1738 119/75     Pulse Rate 12/29/17 1738 88     Resp 12/29/17 1738 18     Temp 12/29/17 1738 98.7 F (37.1 C)     Temp Source 12/29/17 1738 Oral     SpO2 12/29/17 1738 96 %     Weight --      Height --      Head Circumference --      Peak Flow --      Pain Score 12/29/17 1739 6     Pain Loc --      Pain Edu? --      Excl. in GC? --  No data found.  Updated Vital Signs BP 119/75 (BP Location: Left Arm)   Pulse 88   Temp 98.7 F (37.1 C) (Oral)   Resp 18   SpO2 96%   Visual Acuity Right Eye Distance:   Left Eye Distance:   Bilateral Distance:    Right Eye Near:   Left Eye Near:    Bilateral Near:     Physical Exam  Constitutional: He is oriented to person, place, and time. He appears well-developed and well-nourished.  HENT:  Head: Normocephalic.  Pulmonary/Chest: Effort normal.  Musculoskeletal: He exhibits tenderness. He exhibits no edema or deformity.  Left knee is cool without effusion or pain to palpation. Full ROM with mild discomfort to full flexion  Neurological: He is alert and oriented to person, place, and time.  Skin: Skin is warm and dry. No rash noted. No erythema.  Psychiatric: His behavior is normal.  Nursing note and vitals reviewed.    UC Treatments / Results  Labs (all labs ordered are listed, but only abnormal results are displayed) Labs Reviewed - No data to display  EKG None  Radiology No results found.  Procedures Procedures  (including critical care time)  Medications Ordered in UC Medications - No data to display  Initial Impression / Assessment and Plan / UC Course  I have reviewed the triage vital signs and the nursing notes.  Pertinent labs & imaging results that were available during my care of the patient were reviewed by me and considered in my medical decision making (see chart for details).    Treat with Kenalog 80mg  joint injection and start a daily NSAID. Follow up with Orthopedics at beginning of year for appropriate imaging.   Final Clinical Impressions(s) / UC Diagnoses   Final diagnoses:  Primary osteoarthritis of left knee  Chronic pain of left knee     Discharge Instructions     Most like a flare of your osteoarthritis. The shot should help. Please take it east over next few days. Ice and elevate. Take the anti-inflammatory daily as needed for pain. Tylenol can be added as directed if needed.    ED Prescriptions    Medication Sig Dispense Auth. Provider   meloxicam (MOBIC) 7.5 MG tablet Take 2 tablets (15 mg total) by mouth daily. 30 tablet Riki Sheer, [place]     Controlled Substance Prescriptions [place] Controlled Substance Registry consulted? no    Riki Sheer, [place] 12/29/17 1820

## 2018-02-01 ENCOUNTER — Encounter (HOSPITAL_COMMUNITY): Payer: Self-pay | Admitting: *Deleted

## 2018-02-01 ENCOUNTER — Other Ambulatory Visit: Payer: Self-pay

## 2018-02-01 ENCOUNTER — Ambulatory Visit (HOSPITAL_COMMUNITY)
Admission: EM | Admit: 2018-02-01 | Discharge: 2018-02-01 | Disposition: A | Discharge status: Home / Self Care | Payer: Self-pay | Attending: Physician Assistant | Admitting: Physician Assistant

## 2018-02-01 DIAGNOSIS — B349 Viral infection, unspecified: Secondary | ICD-10-CM | POA: Insufficient documentation

## 2018-02-01 MED ORDER — FLUTICASONE PROPIONATE 50 MCG/ACT NA SUSP: 2.0000 | Freq: Every day | NASAL | 0 refills | Status: DC

## 2018-02-01 MED ORDER — HYDROCODONE-HOMATROPINE 5-1.5 MG/5ML PO SYRP: 5.0000 mL | ORAL_SOLUTION | Freq: Four times a day (QID) | ORAL | 0 refills | Status: DC | PRN

## 2018-02-01 MED ORDER — IPRATROPIUM BROMIDE 0.06 % NA SOLN: 2.0000 | Freq: Four times a day (QID) | NASAL | 0 refills | Status: DC

## 2018-02-01 NOTE — ED Provider Notes (Signed)
[facility]    CSN: [id number] Arrival date & time: 02/01/18  1413     History   Chief Complaint Chief Complaint  Patient presents with  . Cough    HPI Vincent Cook is a 53 y.o. male.   53 year old male comes in for 3 day history of URI symptoms. Has had cough, congestion, rhinorrhea. Denies fever, chills, night sweats. States has had coughing fits despite otc medication, and has had a few episode of post tussive emesis. Former smoker.      Past Medical History:  Diagnosis Date  . Pneumonia 2008   [place], 3 day hospitalization    There are no active problems to display for this patient.   Past Surgical History:  Procedure Laterality Date  . KNEE SURGERY     left, ligament repair, Dr. Priscille Kluver  . TONSILLECTOMY         Home Medications    Prior to Admission medications   Medication Sig Start Date End Date Taking? Authorizing Provider  meloxicam (MOBIC) 7.5 MG tablet Take 2 tablets (15 mg total) by mouth daily. 12/29/17 02/01/18 Yes Young, Dillard Cannon, PA-C  fluticasone (FLONASE) 50 MCG/ACT nasal spray Place 2 sprays into both nostrils daily. 02/01/18   Cathie Hoops,  V, PA-C  HYDROcodone-homatropine (HYCODAN) 5-1.5 MG/5ML syrup Take 5 mLs by mouth every 6 (six) hours as needed for cough. 02/01/18   Cathie Hoops,  V, PA-C  ipratropium (ATROVENT) 0.06 % nasal spray Place 2 sprays into both nostrils 4 (four) times daily. 02/01/18   Cathie Hoops,  V, PA-C  sildenafil (REVATIO) 20 MG tablet Take 1 tablet (20 mg total) by mouth daily as needed. 05/04/14   Tysinger, Kermit Balo, PA-C  sildenafil (VIAGRA) 50 MG tablet 1/2-1 tablet po prn, max 1 daily Patient not taking: Reported on 03/30/2014 07/02/13   Tysinger, Kermit Balo, PA-C    Family History Family History  Problem Relation Age of Onset  . Diabetes Mother   . Diabetes Brother   . Cancer Maternal Uncle        prostate  . Heart disease Neg Hx   . Hypertension Neg Hx   . Stroke Neg Hx     Social History Social History   Tobacco  Use  . Smoking status: Former Smoker    Packs/day: 0.00    Last attempt to quit: 02/25/2016    Years since quitting: 1.9  . Smokeless tobacco: Never Used  Substance Use Topics  . Alcohol use: Yes    Alcohol/week: 12.0 standard drinks    Types: 12 Cans of beer per week  . Drug use: Not Currently     Allergies   Patient has no known allergies.   Review of Systems Review of Systems  Reason unable to perform ROS: See HPI as above.     Physical Exam Triage Vital Signs ED Triage Vitals  Enc Vitals Group     BP 02/01/18 1549 119/66     Pulse --      Resp --      Temp --      Temp src --      SpO2 --      Weight --      Height --      Head Circumference --      Peak Flow --      Pain Score 02/01/18 1548 8     Pain Loc --      Pain Edu? --  Excl. in GC? --    No data found.  Updated Vital Signs BP 127/84 (BP Location: Right Arm)   Pulse 65   Temp 98.2 F (36.8 C) (Oral)   Resp 18   SpO2 96%   Physical Exam Constitutional:      General: He is not in acute distress.    Appearance: He is well-developed. He is not ill-appearing, toxic-appearing or diaphoretic.  HENT:     Head: Normocephalic and atraumatic.     Right Ear: Tympanic membrane, ear canal and external ear normal. Tympanic membrane is not erythematous or bulging.     Left Ear: Tympanic membrane, ear canal and external ear normal. Tympanic membrane is not erythematous or bulging.     Nose: Congestion and rhinorrhea present.     Right Sinus: No maxillary sinus tenderness or frontal sinus tenderness.     Left Sinus: No maxillary sinus tenderness or frontal sinus tenderness.     Mouth/Throat:     Pharynx: Uvula midline.  Eyes:     Conjunctiva/sclera: Conjunctivae normal.     Pupils: Pupils are equal, round, and reactive to light.  Neck:     Musculoskeletal: Normal range of motion and neck supple.  Cardiovascular:     Rate and Rhythm: Normal rate and regular rhythm.     Heart sounds: Normal heart  sounds. No murmur. No friction rub. No gallop.   Pulmonary:     Effort: Pulmonary effort is normal.     Breath sounds: Normal breath sounds. No decreased breath sounds, wheezing, rhonchi or rales.  Lymphadenopathy:     Cervical: No cervical adenopathy.  Skin:    General: Skin is warm and dry.  Neurological:     Mental Status: He is alert and oriented to person, place, and time.  Psychiatric:        Behavior: Behavior normal.        Judgment: Judgment normal.      UC Treatments / Results  Labs (all labs ordered are listed, but only abnormal results are displayed) Labs Reviewed - No data to display  EKG None  Radiology No results found.  Procedures Procedures (including critical care time)  Medications Ordered in UC Medications - No data to display  Initial Impression / Assessment and Plan / UC Course  I have reviewed the triage vital signs and the nursing notes.  Pertinent labs & imaging results that were available during my care of the patient were reviewed by me and considered in my medical decision making (see chart for details).    Discussed with patient history and exam most consistent with viral URI. Symptomatic treatment as needed. Push fluids. Return precautions given.   Final Clinical Impressions(s) / UC Diagnoses   Final diagnoses:  Viral illness    ED Prescriptions    Medication Sig Dispense Auth. Provider   fluticasone (FLONASE) 50 MCG/ACT nasal spray Place 2 sprays into both nostrils daily. 1 g ,  V, PA-C   ipratropium (ATROVENT) 0.06 % nasal spray Place 2 sprays into both nostrils 4 (four) times daily. 15 mL ,  V, PA-C   HYDROcodone-homatropine (HYCODAN) 5-1.5 MG/5ML syrup Take 5 mLs by mouth every 6 (six) hours as needed for cough. 120 mL Linward Headland,  V, PA-C     Controlled Substance Prescriptions [place] Controlled Substance Registry consulted? Yes, I have consulted the  Controlled Substances Registry for this patient, and feel the risk/benefit  ratio today is favorable for proceeding with this prescription for a controlled substance.  Belinda Fisher, PA-C 02/01/18 1629

## 2018-02-01 NOTE — Discharge Instructions (Addendum)
Hycodan as needed for cough, this can make you drowsy, avoid driving or making big decision while on medicine. Start flonase, atrovent nasal spray for nasal congestion/drainage. You can use over the counter nasal saline rinse such as neti pot for nasal congestion. Keep hydrated, your urine should be clear to pale yellow in color. Tylenol/motrin for fever and pain. Monitor for any worsening of symptoms, chest pain, shortness of breath, wheezing, swelling of the throat, follow up for reevaluation.   For sore throat/cough try using a honey-based tea. Use 3 teaspoons of honey with juice squeezed from half lemon. Place shaved pieces of ginger into 1/2-1 cup of water and warm over stove top. Then mix the ingredients and repeat every 4 hours as needed.

## 2018-02-01 NOTE — ED Triage Notes (Signed)
C/O cough x 3 days; having coughing fits causing vomiting.  Unk fevers.

## 2018-02-06 ENCOUNTER — Telehealth (HOSPITAL_COMMUNITY): Payer: Self-pay | Admitting: Physician Assistant

## 2018-02-06 MED ORDER — IPRATROPIUM BROMIDE 0.06 % NA SOLN: 2.0000 | Freq: Four times a day (QID) | NASAL | 0 refills | Status: DC

## 2018-02-06 MED ORDER — HYDROCODONE-HOMATROPINE 5-1.5 MG/5ML PO SYRP: 5.0000 mL | ORAL_SOLUTION | Freq: Four times a day (QID) | ORAL | 0 refills | Status: DC | PRN

## 2018-02-06 MED ORDER — FLUTICASONE PROPIONATE 50 MCG/ACT NA SUSP: 2.0000 | Freq: Every day | NASAL | 0 refills | Status: DC

## 2018-02-06 NOTE — Telephone Encounter (Signed)
Needed medication switched to different pharmacy

## 2018-02-09 ENCOUNTER — Encounter (HOSPITAL_COMMUNITY): Payer: Self-pay | Admitting: Emergency Medicine

## 2018-02-09 ENCOUNTER — Ambulatory Visit (HOSPITAL_COMMUNITY)
Admission: EM | Admit: 2018-02-09 | Discharge: 2018-02-09 | Disposition: A | Discharge status: Home / Self Care | Payer: Self-pay | Attending: Family Medicine | Admitting: Family Medicine

## 2018-02-09 ENCOUNTER — Other Ambulatory Visit: Payer: Self-pay

## 2018-02-09 ENCOUNTER — Ambulatory Visit (INDEPENDENT_AMBULATORY_CARE_PROVIDER_SITE_OTHER): Payer: Self-pay

## 2018-02-09 DIAGNOSIS — J40 Bronchitis, not specified as acute or chronic: Secondary | ICD-10-CM | POA: Insufficient documentation

## 2018-02-09 MED ORDER — PREDNISONE 10 MG PO TABS: 40.0000 mg | ORAL_TABLET | Freq: Every day | ORAL | 0 refills | Status: AC

## 2018-02-09 MED ORDER — ALBUTEROL SULFATE HFA 108 (90 BASE) MCG/ACT IN AERS
INHALATION_SPRAY | RESPIRATORY_TRACT | Status: AC
  Filled 2018-02-09: qty 6.7

## 2018-02-09 MED ORDER — ALBUTEROL SULFATE HFA 108 (90 BASE) MCG/ACT IN AERS
2.0000 | INHALATION_SPRAY | Freq: Once | RESPIRATORY_TRACT | Status: AC
  Administered 2018-02-09: 2 via RESPIRATORY_TRACT

## 2018-02-09 NOTE — Discharge Instructions (Addendum)
Your x ray did not reveal any abnormalities  I will go ahead and treat you for bronchitis We will do 5 days of steroids.  Albuterol inhaler given here in the clinic for cough, wheezing and SOB.  You can continue the hycodan.  Follow up as needed for continued or worsening symptoms

## 2018-02-09 NOTE — ED Provider Notes (Signed)
[facility]    CSN: [id number] Arrival date & time: 02/09/18  1332     History   Chief Complaint Chief Complaint  Patient presents with  . Cough    HPI Vincent Cook is a 53 y.o. male.   Patient is a [age] year old male that presents with worsening cough, congestion, body aches.  His symptoms have been worsening over the past 2 weeks.  He was seen here on 02/01/2017.  He has been taking hydrocodone cough syrup for his symptoms.  He is having some mild chest discomfort and feels as if he cannot cough the mucus up.  He denies any shortness of breath but is having trouble getting a deep breath.  Denies any nasal congestion, rhinorrhea, sore throat, ear pain.  He has had some mild nausea without vomiting or diarrhea. He is a former smoker.   ROS per HPI      Past Medical History:  Diagnosis Date  . Pneumonia 2008   [place], 3 day hospitalization    There are no active problems to display for this patient.   Past Surgical History:  Procedure Laterality Date  . KNEE SURGERY     left, ligament repair, Dr. Priscille Kluverendall  . TONSILLECTOMY         Home Medications    Prior to Admission medications   Medication Sig Start Date End Date Taking? Authorizing Provider  HYDROcodone-homatropine (HYCODAN) 5-1.5 MG/5ML syrup Take 5 mLs by mouth every 6 (six) hours as needed for cough. 02/06/18  Yes Yu, Amy V, PA-C  fluticasone (FLONASE) 50 MCG/ACT nasal spray Place 2 sprays into both nostrils daily. 02/06/18   Cathie HoopsYu, Amy V, PA-C  ipratropium (ATROVENT) 0.06 % nasal spray Place 2 sprays into both nostrils 4 (four) times daily. 02/06/18   Cathie HoopsYu, Amy V, PA-C  meloxicam (MOBIC) 7.5 MG tablet Take 2 tablets (15 mg total) by mouth daily. 12/29/17 02/01/18  Riki SheerYoung, Michelle G, PA-C  predniSONE (DELTASONE) 10 MG tablet Take 4 tablets (40 mg total) by mouth daily for 5 days. 02/09/18 02/14/18  Dahlia ByesBast, Lue Sykora A, NP  sildenafil (REVATIO) 20 MG tablet Take 1 tablet (20 mg total) by mouth daily as needed.  05/04/14   Tysinger, Kermit Baloavid S, PA-C  sildenafil (VIAGRA) 50 MG tablet 1/2-1 tablet po prn, max 1 daily Patient not taking: Reported on 03/30/2014 07/02/13   Tysinger, Kermit Baloavid S, PA-C    Family History Family History  Problem Relation Age of Onset  . Diabetes Mother   . Diabetes Brother   . Cancer Maternal Uncle        prostate  . Heart disease Neg Hx   . Hypertension Neg Hx   . Stroke Neg Hx     Social History Social History   Tobacco Use  . Smoking status: Former Smoker    Packs/day: 0.00    Last attempt to quit: 02/25/2016    Years since quitting: 1.9  . Smokeless tobacco: Never Used  Substance Use Topics  . Alcohol use: Yes    Alcohol/week: 12.0 standard drinks    Types: 12 Cans of beer per week  . Drug use: Not Currently     Allergies   Patient has no known allergies.   Review of Systems Review of Systems   Physical Exam Triage Vital Signs ED Triage Vitals  Enc Vitals Group     BP 02/09/18 1420 111/71     Pulse Rate 02/09/18 1420 81     Resp 02/09/18 1420 18  Temp 02/09/18 1420 98.2 F (36.8 C)     Temp Source 02/09/18 1420 Temporal     SpO2 02/09/18 1420 98 %     Weight --      Height --      Head Circumference --      Peak Flow --      Pain Score 02/09/18 1417 9     Pain Loc --      Pain Edu? --      Excl. in GC? --    No data found.  Updated Vital Signs BP 111/71 (BP Location: Right Arm)   Pulse 81   Temp 98.2 F (36.8 C) (Temporal)   Resp 18   SpO2 98%   Visual Acuity Right Eye Distance:   Left Eye Distance:   Bilateral Distance:    Right Eye Near:   Left Eye Near:    Bilateral Near:     Physical Exam Vitals signs and nursing note reviewed.  Constitutional:      General: He is not in acute distress.    Appearance: Normal appearance. He is not ill-appearing, toxic-appearing or diaphoretic.  HENT:     Head: Normocephalic and atraumatic.     Right Ear: Tympanic membrane, ear canal and external ear normal.     Left Ear: Tympanic  membrane, ear canal and external ear normal.     Nose: Congestion present.     Mouth/Throat:     Pharynx: Oropharynx is clear.  Eyes:     Conjunctiva/sclera: Conjunctivae normal.  Neck:     Musculoskeletal: Normal range of motion.  Cardiovascular:     Rate and Rhythm: Normal rate and regular rhythm.     Pulses: Normal pulses.     Heart sounds: Normal heart sounds.  Pulmonary:     Effort: Pulmonary effort is normal.     Comments: Decreased lung sounds in all fields  Mild wheezing heard in the lung bases.  Musculoskeletal: Normal range of motion.  Lymphadenopathy:     Cervical: No cervical adenopathy.  Skin:    General: Skin is warm and dry.     Findings: No rash.  Neurological:     Mental Status: He is alert.  Psychiatric:        Mood and Affect: Mood normal.      UC Treatments / Results  Labs (all labs ordered are listed, but only abnormal results are displayed) Labs Reviewed - No data to display  EKG None  Radiology Dg Chest 2 View  Result Date: 02/09/2018 CLINICAL DATA:  Cough, shortness of breath and low grade fever. EXAM: CHEST - 2 VIEW COMPARISON:  02/07/2014 FINDINGS: Cardiomediastinal silhouette is normal. Mediastinal contours appear intact. There is no evidence of focal airspace consolidation, pleural effusion or pneumothorax. Osseous structures are without acute abnormality. Soft tissues are grossly normal. IMPRESSION: No active cardiopulmonary disease. Electronically Signed   By: Ted Mcalpine M.D.   On: 02/09/2018 14:54    Procedures Procedures (including critical care time)  Medications Ordered in UC Medications  albuterol (PROVENTIL HFA;VENTOLIN HFA) 108 (90 Base) MCG/ACT inhaler 2 puff (2 puffs Inhalation Provided for home use 02/09/18 1540)    Initial Impression / Assessment and Plan / UC Course  I have reviewed the triage vital signs and the nursing notes.  Pertinent labs & imaging results that were available during my care of the patient  were reviewed by me and considered in my medical decision making (see chart for details).  Patient is a 53 year old male that comes in with worsening cough, congestion over the past couple weeks. X-ray did not reveal any acute abnormalities. We will go ahead and treat for bronchitis. Prednisone daily for the next 5 days Albuterol inhaler given here in clinic for cough, wheezing, shortness of breath. He already has Hycodan cough syrup he can use at home for cough Follow up as needed for continued or worsening symptoms  Final Clinical Impressions(s) / UC Diagnoses   Final diagnoses:  Bronchitis     Discharge Instructions     Your x ray did not reveal any abnormalities  I will go ahead and treat you for bronchitis We will do 5 days of steroids.  Albuterol inhaler given here in the clinic for cough, wheezing and SOB.  You can continue the hycodan.  Follow up as needed for continued or worsening symptoms     ED Prescriptions    Medication Sig Dispense Auth. Provider   predniSONE (DELTASONE) 10 MG tablet Take 4 tablets (40 mg total) by mouth daily for 5 days. 20 tablet Dahlia ByesBast, Keymiah Lyles A, NP     Controlled Substance Prescriptions [place] Controlled Substance Registry consulted? Not Applicable   Janace ArisBast, Erandy Mceachern A, NP 02/09/18 1626

## 2018-02-09 NOTE — ED Triage Notes (Signed)
Sen 02/01/2018.  Not feeling any better, continues to have cough and aching

## 2018-07-07 ENCOUNTER — Encounter: Payer: Self-pay | Admitting: Medical

## 2018-07-07 ENCOUNTER — Other Ambulatory Visit: Payer: Self-pay

## 2018-07-07 ENCOUNTER — Ambulatory Visit (INDEPENDENT_AMBULATORY_CARE_PROVIDER_SITE_OTHER): Payer: Managed Care, Other (non HMO) | Admitting: Medical

## 2018-07-07 VITALS — BP 120/74 | HR 68 | Temp 98.7°F | Resp 16 | Ht 70.0 in | Wt 204.4 lb

## 2018-07-07 DIAGNOSIS — N529 Male erectile dysfunction, unspecified: Secondary | ICD-10-CM | POA: Diagnosis not present

## 2018-07-07 DIAGNOSIS — R5383 Other fatigue: Secondary | ICD-10-CM | POA: Insufficient documentation

## 2018-07-07 DIAGNOSIS — Z7189 Other specified counseling: Secondary | ICD-10-CM

## 2018-07-07 DIAGNOSIS — Z7185 Encounter for immunization safety counseling: Secondary | ICD-10-CM | POA: Insufficient documentation

## 2018-07-07 DIAGNOSIS — G479 Sleep disorder, unspecified: Secondary | ICD-10-CM | POA: Diagnosis not present

## 2018-07-07 DIAGNOSIS — Z0000 Encounter for general adult medical examination without abnormal findings: Secondary | ICD-10-CM | POA: Diagnosis not present

## 2018-07-07 DIAGNOSIS — R0681 Apnea, not elsewhere classified: Secondary | ICD-10-CM | POA: Insufficient documentation

## 2018-07-07 DIAGNOSIS — Z1159 Encounter for screening for other viral diseases: Secondary | ICD-10-CM | POA: Insufficient documentation

## 2018-07-07 DIAGNOSIS — R0683 Snoring: Secondary | ICD-10-CM | POA: Diagnosis not present

## 2018-07-07 DIAGNOSIS — Z125 Encounter for screening for malignant neoplasm of prostate: Secondary | ICD-10-CM

## 2018-07-07 DIAGNOSIS — Z1211 Encounter for screening for malignant neoplasm of colon: Secondary | ICD-10-CM

## 2018-07-07 LAB — POCT URINALYSIS DIP (PROADVANTAGE DEVICE)
Bilirubin, UA: NEGATIVE
Blood, UA: NEGATIVE
Glucose, UA: NEGATIVE mg/dL
Nitrite, UA: NEGATIVE
Protein Ur, POC: NEGATIVE mg/dL
Specific Gravity, Urine: 1.015
Urobilinogen, Ur: NEGATIVE
pH, UA: 6.5 (ref 5.0–8.0)

## 2018-07-07 LAB — LIPID PANEL

## 2018-07-07 NOTE — Progress Notes (Signed)
Subjective:   HPI  Vincent GrebeDarrell Cook is a 53 y.o. male who presents for Chief Complaint  Patient presents with  . New/Old patient    Fasting CPE    Medical team: Genia Delysinger,  S, PA-C  Last visit 2015.  Here for updated physical   Concerns: Erectile dysfunction.  Diagnosed a few years ago with another practice with ED.  Has used sildenafil and get some benefit on 20 mg but not as good as he thinks it should be.  Problems getting and keeping erections mostly problems and keeping erections.  No prior cardiology consult  He denies chest pain, shortness of breath, no dyspnea on exertion  He continues to have problems sleeping.  Has had sleep problems for years.  No prior sleep study.  He does snore.  There has been some concern in the past for witnessed apnea.  He has actually been put on Seroquel in the past but it was way too strong and along him the first time he ever took it.  He is also used other sleep aids in the past.  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  He has a history of eczema, uses triamcinolone in the past periodically  Reviewed their medical, surgical, family, social, medication, and allergy history and updated chart as appropriate.  Past Medical History:  Diagnosis Date  . Eczema   . Erectile dysfunction 2018  . Pneumonia 2008   [place] [facility], 3 day hospitalization    Past Surgical History:  Procedure Laterality Date  . KNEE SURGERY     left, ligament repair, Dr. Priscille Kluverendall  . TONSILLECTOMY      Social History   Socioeconomic History  . Marital status: Single    Spouse name: Not on file  . Number of children: Not on file  . Years of education: Not on file  . Highest education level: Not on file  Occupational History  . Not on file  Social Needs  . Financial resource strain: Not on file  . Food insecurity:    Worry: Not on file   Inability: Not on file  . Transportation needs:    Medical: Not on file    Non-medical: Not on file  Tobacco Use  . Smoking status: Former Smoker    Packs/day: 1.00    Years: 8.00    Pack years: 8.00    Last attempt to quit: 02/25/2016    Years since quitting: 2.3  . Smokeless tobacco: Never Used  Substance and Sexual Activity  . Alcohol use: Yes    Alcohol/week: 6.0 standard drinks    Types: 6 Cans of beer per week  . Drug use: Not Currently  . Sexual activity: Not on file  Lifestyle  . Physical activity:    Days per week: Not on file    Minutes per session: Not on file  . Stress: Not on file  Relationships  . Social connections:    Talks on phone: Not on file    Gets together: Not on file    Attends religious service: Not on file    Active member of club or organization: Not on file    Attends meetings of clubs or organizations: Not on file    Relationship status: Not on file  . Intimate partner violence:    Fear of current or ex partner: Not on file  Emotionally abused: Not on file    Physically abused: Not on file    Forced sexual activity: Not on file  Other Topics Concern  . Not on file  Social History Narrative   In relatinoship, single.  4 children.  Working at [employer].   Architectural technologistAssistant press operator.    Walks a lot, walks about 5 miles per night.   06/2018.       Family History  Problem Relation Age of Onset  . Diabetes Mother   . Other Father        natural causes  . Diabetes Brother   . Cancer Maternal Uncle        prostate  . Heart disease Neg Hx   . Hypertension Neg Hx   . Stroke Neg Hx      Current Outpatient Medications:  .  fluticasone (FLONASE) 50 MCG/ACT nasal spray, Place 2 sprays into both nostrils daily. (Patient not taking: Reported on 07/07/2018), Disp: 1 g, Rfl: 0 .  HYDROcodone-homatropine (HYCODAN) 5-1.5 MG/5ML syrup, Take 5 mLs by mouth every 6 (six) hours as needed for cough. (Patient not taking: Reported on 07/07/2018), Disp:  120 mL, Rfl: 0 .  ipratropium (ATROVENT) 0.06 % nasal spray, Place 2 sprays into both nostrils 4 (four) times daily. (Patient not taking: Reported on 07/07/2018), Disp: 15 mL, Rfl: 0 .  meloxicam (MOBIC) 7.5 MG tablet, Take 2 tablets (15 mg total) by mouth daily., Disp: 30 tablet, Rfl: 1 .  sildenafil (REVATIO) 20 MG tablet, Take 1 tablet (20 mg total) by mouth daily as needed. (Patient not taking: Reported on 07/07/2018), Disp: 30 tablet, Rfl: 0 .  sildenafil (VIAGRA) 50 MG tablet, 1/2-1 tablet po prn, max 1 daily (Patient not taking: Reported on 03/30/2014), Disp: 10 tablet, Rfl: 2  No Known Allergies    Review of Systems Constitutional: -fever, -chills, -sweats, -unexpected weight change, -decreased appetite, +fatigue Allergy: -sneezing, -itching, -congestion Dermatology: -changing moles, --rash, -lumps ENT: -runny nose, -ear pain, -sore throat, -hoarseness, -sinus pain, -teeth pain, - ringing in ears, -hearing loss, -nosebleeds Cardiology: -chest pain, -palpitations, -swelling, -difficulty breathing when lying flat, -waking up short of breath Respiratory: -cough, -shortness of breath, -difficulty breathing with exercise or exertion, -wheezing, -coughing up blood Gastroenterology: -abdominal pain, -nausea, -vomiting, -diarrhea, -constipation, -blood in stool, -changes in bowel movement, -difficulty swallowing or eating Hematology: -bleeding, -bruising  Musculoskeletal: -joint aches, -muscle aches, -joint swelling, -back pain, -neck pain, -cramping, -changes in gait Ophthalmology: denies vision changes, eye redness, itching, discharge Urology: -burning with urination, -difficulty urinating, -blood in urine, -urinary frequency, -urgency, -incontinence Neurology: -headache, -weakness, -tingling, -numbness, -memory loss, -falls, -dizziness Psychology: -depressed mood, -agitation, +sleep problems Male GU: no testicular mass, pain, no lymph nodes swollen, no swelling, no rash.     Objective:  BP  120/74   Pulse 68   Temp 98.7 F (37.1 C) (Oral)   Resp 16   Ht 5\' 10"  (1.778 m)   Wt 204 lb 6.4 oz (92.7 kg)   SpO2 96%   BMI 29.33 kg/m   General appearance: alert, no distress, WD/WN, African American male Skin: No worrisome lesions HEENT: normocephalic, conjunctiva/corneas normal, sclerae anicteric, PERRLA, EOMi, nares patent, no discharge or erythema, pharynx normal Oral cavity: MMM, tongue normal, teeth normal Neck: supple, no lymphadenopathy, no thyromegaly, no masses, normal ROM, no bruits Chest: non tender, normal shape and expansion Heart: RRR, normal S1, S2, no murmurs Lungs: CTA bilaterally, no wheezes, rhonchi, or rales Abdomen: +bs, soft, non tender, non  distended, no masses, no hepatomegaly, no splenomegaly, no bruits Back: non tender, normal ROM, no scoliosis Musculoskeletal: upper extremities non tender, no obvious deformity, normal ROM throughout, lower extremities non tender, no obvious deformity, normal ROM throughout Extremities: no edema, no cyanosis, no clubbing Pulses: 2+ symmetric, upper and lower extremities, normal cap refill Neurological: alert, oriented x 3, CN2-12 intact, strength normal upper extremities and lower extremities, sensation normal throughout, DTRs 2+ throughout, no cerebellar signs, gait normal Psychiatric: normal affect, behavior normal, pleasant  GU: normal male external genitalia,circumcised, nontender, no masses, no hernia, no lymphadenopathy Rectal: anus normal tone, prostate WNL  EKG Indication physical erectile dysfunction, rate 65 bpm, normal sinus rhythm, PR interval 152 ms, QRS 94 ms, QTC 434 ms, axis 17 degrees, incomplete right bundle branch block.  No prior to compare   Assessment and Plan :   Encounter Diagnoses  Name Primary?  . Encounter for health maintenance examination in adult Yes  . Sleep disturbance   . Snoring   . Erectile dysfunction, unspecified erectile dysfunction type   . Witnessed apneic spells   .  Fatigue, unspecified type   . Screen for colon cancer   . Screening for prostate cancer   . Vaccine counseling   . Encounter for hepatitis C screening test for low risk patient     Physical exam - discussed and counseled on healthy lifestyle, diet, exercise, preventative care, vaccinations, sick and well care, proper use of emergency dept and after hours care, and addressed their concerns.    Health screening: See your eye doctor yearly for routine vision care. See your dentist yearly for routine dental care including hygiene visits twice yearly.  Cancer screening  Colonoscopy:  Plan for cologard after we discussed options for screening  Discussed PSA, prostate exam, and prostate cancer screening risks/benefits.      Vaccinations: Advised yearly influenza vaccine He is up to date on Td vaccine  Shingles vaccine:  I recommend you have a shingles vaccine to help prevent shingles or herpes zoster outbreak.   Please call your insurer to inquire about coverage for the Shingrix vaccine given in 2 doses.   Some insurers cover this vaccine after age 62, some cover this after age 36.  If your insurer covers this, then call to schedule appointment to have this vaccine here.   Separate significant issues discussed: Specific Concerns today:  Erectile dysfunction . We can consider other medication options . I would like you to call your insurance to find out with the preferred medication is on your formulary . Current options include sildenafil, Viagra, Cialis, Levitra, Staxyn, Stendra  Sleep disturbance  Pending labs we may recommend a sleep study  Symptoms of sleep apnea include snoring, witnessed apnea, daytime sleepiness, non-restful sleep  If you have sleep apnea, using a sleep aid could actually make this worse   [place] was seen today for new/old patient.  Diagnoses and all orders for this visit:  Encounter for health maintenance examination in adult -     Comprehensive  metabolic panel -     CBC -     Lipid panel -     PSA -     EKG 12-Lead -     Hepatitis C antibody  Sleep disturbance  Snoring  Erectile dysfunction, unspecified erectile dysfunction type -     EKG 12-Lead  Witnessed apneic spells  Fatigue, unspecified type  Screen for colon cancer  Screening for prostate cancer -     PSA  Vaccine  counseling  Encounter for hepatitis C screening test for low risk patient -     Hepatitis C antibody   Follow-up pending labs, yearly for physical

## 2018-07-07 NOTE — Patient Instructions (Addendum)
Thanks for trusting us with your health care and for coming in for a physical today.  Below are some general recommendations I have for you:  Yearly screenings See your eye doctor yearly for routine vision care. See your dentist yearly for routine dental care including hygiene visits twice yearly. See me here yearly for a routine physical and preventative care visit   Specific Concerns today:  Erectile dysfunction . We can consider other medication options . I would like you to call your insurance to find out with the preferred medication is on your formulary . Current options include sildenafil, Viagra, Cialis, Levitra, Staxyn, Stendra  Sleep disturbance  Pending labs we may recommend a sleep study  Symptoms of sleep apnea include snoring, witnessed apnea, daytime sleepiness, non-restful sleep  If you have sleep apnea, using a sleep aid could actually make this worse   Vaccines: I recommend a yearly flu shot  Shingles vaccine:  I recommend you have a shingles vaccine to help prevent shingles or herpes zoster outbreak.   Please call your insurer to inquire about coverage for the Shingrix vaccine given in 2 doses.   Some insurers cover this vaccine after age 55, some cover this after age 55.  If your insurer covers this, then call to schedule appointment to have this vaccine here.   We will call with lab results and recommendations  We will refer you for Cologuard colon cancer screening unless your labs show anemia   Please follow up yearly for a physical.   Preventative Care for Adults - Male      MAINTAIN REGULAR HEALTH EXAMS:  A routine yearly physical is a good way to check in with your primary care provider about your health and preventive screening. It is also an opportunity to share updates about your health and any concerns you have, and receive a thorough all-over exam.   Most health insurance companies pay for at least some preventative services.  Check with your  health plan for specific coverages.  WHAT PREVENTATIVE SERVICES DO MEN NEED?  Adult men should have their weight and blood pressure checked regularly.   Men age 55 and older should have their cholesterol levels checked regularly.  Beginning at age 55 and continuing to age 55, men should be screened for colorectal cancer.  Certain people may need continued testing until age 55.  Updating vaccinations is part of preventative care.  Vaccinations help protect against diseases such as the flu.  Osteoporosis is a disease in which the bones lose minerals and strength as we age. Men ages 4965 and over should discuss this with their caregivers  Lab tests are generally done as part of preventative care to screen for anemia and blood disorders, to screen for problems with the kidneys and liver, to screen for bladder problems, to check blood sugar, and to check your cholesterol level.  Preventative services generally include counseling about diet, exercise, avoiding tobacco, drugs, excessive alcohol consumption, and sexually transmitted infections.    GENERAL RECOMMENDATIONS FOR GOOD HEALTH:  Healthy diet:  Eat a variety of foods, including fruit, vegetables, animal or vegetable protein, such as meat, fish, chicken, and eggs, or beans, lentils, tofu, and grains, such as rice.  Drink plenty of water daily.  Decrease saturated fat in the diet, avoid lots of red meat, processed foods, sweets, fast foods, and fried foods.  Exercise:  Aerobic exercise helps maintain good heart health. At least 30-40 minutes of moderate-intensity exercise is recommended. For example, a brisk  walk that increases your heart rate and breathing. This should be done on most days of the week.   Find a type of exercise or a variety of exercises that you enjoy so that it becomes a part of your daily life.  Examples are running, walking, swimming, water aerobics, and biking.  For motivation and support, explore group exercise  such as aerobic class, spin class, Zumba, Yoga,or  martial arts, etc.    Set exercise goals for yourself, such as a certain weight goal, walk or run in a race such as a 5k walk/run.  Speak to your primary care provider about exercise goals.  Disease prevention:  If you smoke or chew tobacco, find out from your caregiver how to quit. It can literally save your life, no matter how long you have been a tobacco user. If you do not use tobacco, never begin.   Maintain a healthy diet and normal weight. Increased weight leads to problems with blood pressure and diabetes.   The Body Mass Index or BMI is a way of measuring how much of your body is fat. Having a BMI above 27 increases the risk of heart disease, diabetes, hypertension, stroke and other problems related to obesity. Your caregiver can help determine your BMI and based on it develop an exercise and dietary program to help you achieve or maintain this important measurement at a healthful level.  High blood pressure causes heart and blood vessel problems.  Persistent high blood pressure should be treated with medicine if weight loss and exercise do not work.   Fat and cholesterol leaves deposits in your arteries that can block them. This causes heart disease and vessel disease elsewhere in your body.  If your cholesterol is found to be high, or if you have heart disease or certain other medical conditions, then you may need to have your cholesterol monitored frequently and be treated with medication.   Ask if you should have a cardiac stress test if your history suggests this. A stress test is a test done on a treadmill that looks for heart disease. This test can find disease prior to there being a problem.  Osteoporosis is a disease in which the bones lose minerals and strength as we age. This can result in serious bone fractures. Risk of osteoporosis can be identified using a bone density scan. Men ages 8965 and over should discuss this with their  caregivers. Ask your caregiver whether you should be taking a calcium supplement and Vitamin D, to reduce the rate of osteoporosis.   Avoid drinking alcohol in excess (more than two drinks per day).  Avoid use of street drugs. Do not share needles with anyone. Ask for professional help if you need assistance or instructions on stopping the use of alcohol, cigarettes, and/or drugs.  Brush your teeth twice a day with fluoride toothpaste, and floss once a day. Good oral hygiene prevents tooth decay and gum disease. The problems can be painful, unattractive, and can cause other health problems. Visit your dentist for a routine oral and dental check up and preventive care every 6-12 months.   Look at your skin regularly.  Use a mirror to look at your back. Notify your caregivers of changes in moles, especially if there are changes in shapes, colors, a size larger than a pencil eraser, an irregular border, or development of new moles.  Safety:  Use seatbelts 100% of the time, whether driving or as a passenger.  Use safety devices such as  hearing protection if you work in environments with loud noise or significant background noise.  Use safety glasses when doing any work that could send debris in to the eyes.  Use a helmet if you ride a bike or motorcycle.  Use appropriate safety gear for contact sports.  Talk to your caregiver about gun safety.  Use sunscreen with a SPF (or skin protection factor) of 15 or greater.  Lighter skinned people are at a greater risk of skin cancer. Don't forget to also wear sunglasses in order to protect your eyes from too much damaging sunlight. Damaging sunlight can accelerate cataract formation.   Practice safe sex. Use condoms. Condoms are used for birth control and to help reduce the spread of sexually transmitted infections (or STIs).  Some of the STIs are gonorrhea (the clap), chlamydia, syphilis, trichomonas, herpes, HPV (human papilloma virus) and HIV (human  immunodeficiency virus) which causes AIDS. The herpes, HIV and HPV are viral illnesses that have no cure. These can result in disability, cancer and death.   Keep carbon monoxide and smoke detectors in your home functioning at all times. Change the batteries every 6 months or use a model that plugs into the wall.   Vaccinations:  Stay up to date with your tetanus shots and other required immunizations. You should have a booster for tetanus every 10 years. Be sure to get your flu shot every year, since 5%-20% of the [place] population comes down with the flu. The flu vaccine changes each year, so being vaccinated once is not enough. Get your shot in the fall, before the flu season peaks.   Other vaccines to consider:  Human Papilloma Virus or HPV causes cancer of the cervix, and other infections that can be transmitted from person to person. There is a vaccine for HPV, and males should get immunized between the ages of 7511 and 6526. It requires a series of 3 shots.   Pneumococcal vaccine to protect against certain types of pneumonia.  This is normally recommended for adults age 55 or older.  However, adults younger than 55 years old with certain underlying conditions such as diabetes, heart or lung disease should also receive the vaccine.  Shingles vaccine to protect against Varicella Zoster if you are older than age 55, or younger than 55 years old with certain underlying illness.  If you have not had the Shingrix vaccine, please call your insurer to inquire about coverage for the Shingrix vaccine given in 2 doses.   Some insurers cover this vaccine after age 55, some cover this after age 55.  If your insurer covers this, then call to schedule appointment to have this vaccine here  Hepatitis A vaccine to protect against a form of infection of the liver by a virus acquired from food.  Hepatitis B vaccine to protect against a form of infection of the liver by a virus acquired from blood or body fluids,  particularly if you work in health care.  If you plan to travel internationally, check with your local health department for specific vaccination recommendations.   What should I know about cancer screening? Many types of cancers can be detected early and may often be prevented. Lung Cancer  You should be screened every year for lung cancer if: ? You are a current smoker who has smoked for at least 30 years. ? You are a former smoker who has quit within the past 15 years.  Talk to your health care provider about your screening options,  when you should start screening, and how often you should be screened.  Colorectal Cancer  Routine colorectal cancer screening usually begins at 55 years of age and should be repeated every 5-10 years until you are 55 years old. You may need to be screened more often if early forms of precancerous polyps or small growths are found. Your health care provider may recommend screening at an earlier age if you have risk factors for colon cancer.  Your health care provider may recommend using home test kits to check for hidden blood in the stool.  A small camera at the end of a tube can be used to examine your colon (sigmoidoscopy or colonoscopy). This checks for the earliest forms of colorectal cancer.  Prostate and Testicular Cancer  Depending on your age and overall health, your health care provider may do certain tests to screen for prostate and testicular cancer.  Talk to your health care provider about any symptoms or concerns you have about testicular or prostate cancer.  Skin Cancer  Check your skin from head to toe regularly.  Tell your health care provider about any new moles or changes in moles, especially if: ? There is a change in a mole's size, shape, or color. ? You have a mole that is larger than a pencil eraser.  Always use sunscreen. Apply sunscreen liberally and repeat throughout the day.  Protect yourself by wearing long sleeves,  pants, a wide-brimmed hat, and sunglasses when outside.

## 2018-07-08 ENCOUNTER — Other Ambulatory Visit: Payer: Self-pay | Admitting: Medical

## 2018-07-08 LAB — COMPREHENSIVE METABOLIC PANEL
ALT: 18 IU/L (ref 0–44)
AST: 23 IU/L (ref 0–40)
Albumin/Globulin Ratio: 1.4 (ref 1.2–2.2)
Albumin: 4.3 g/dL (ref 3.8–4.9)
Alkaline Phosphatase: 61 IU/L (ref 39–117)
BUN/Creatinine Ratio: 9 (ref 9–20)
BUN: 11 mg/dL (ref 6–24)
Bilirubin Total: 1 mg/dL (ref 0.0–1.2)
CO2: 21 mmol/L (ref 20–29)
Calcium: 9 mg/dL (ref 8.7–10.2)
Chloride: 103 mmol/L (ref 96–106)
Creatinine, Ser: 1.25 mg/dL (ref 0.76–1.27)
GFR calc Af Amer: 75 mL/min/{1.73_m2} (ref >59)
GFR calc non Af Amer: 65 mL/min/{1.73_m2} (ref >59)
Globulin, Total: 3.1 g/dL (ref 1.5–4.5)
Glucose: 92 mg/dL (ref 65–99)
Potassium: 4.4 mmol/L (ref 3.5–5.2)
Sodium: 139 mmol/L (ref 134–144)
Total Protein: 7.4 g/dL (ref 6.0–8.5)

## 2018-07-08 LAB — LIPID PANEL
Chol/HDL Ratio: 2.7 ratio (ref 0.0–5.0)
Cholesterol, Total: 153 mg/dL (ref 100–199)
HDL: 57 mg/dL (ref >39)
LDL Calculated: 46 mg/dL (ref 0–99)
Triglycerides: 248 mg/dL — ABNORMAL HIGH (ref 0–149)
VLDL Cholesterol Cal: 50 mg/dL — ABNORMAL HIGH (ref 5–40)

## 2018-07-08 LAB — CBC
Hematocrit: 47.6 % (ref 37.5–51.0)
Hemoglobin: 16 g/dL (ref 13.0–17.7)
MCH: 32.1 pg (ref 26.6–33.0)
MCHC: 33.6 g/dL (ref 31.5–35.7)
MCV: 95 fL (ref 79–97)
Platelets: 236 10*3/uL (ref 150–450)
RBC: 4.99 x10E6/uL (ref 4.14–5.80)
RDW: 12.1 % (ref 11.6–15.4)
WBC: 6.5 10*3/uL (ref 3.4–10.8)

## 2018-07-08 LAB — HEPATITIS C ANTIBODY: Hep C Virus Ab: 0.2 s/co ratio (ref 0.0–0.9)

## 2018-07-08 LAB — PSA: Prostate Specific Ag, Serum: 0.8 ng/mL (ref 0.0–4.0)

## 2018-07-08 MED ORDER — TADALAFIL 20 MG PO TABS: 20.0000 mg | ORAL_TABLET | Freq: Every day | ORAL | 0 refills | Status: DC | PRN

## 2018-07-08 NOTE — Progress Notes (Signed)
cial

## 2018-07-09 ENCOUNTER — Telehealth: Payer: Self-pay

## 2018-07-09 ENCOUNTER — Other Ambulatory Visit: Payer: Self-pay | Admitting: Medical

## 2018-07-09 MED ORDER — SILDENAFIL CITRATE 100 MG PO TABS: 100.0000 mg | ORAL_TABLET | ORAL | 1 refills | Status: DC | PRN

## 2018-07-09 NOTE — Telephone Encounter (Signed)
Patient called states that he needs sidenafil called in cialis was too expensive.

## 2018-07-27 ENCOUNTER — Emergency Department (HOSPITAL_BASED_OUTPATIENT_CLINIC_OR_DEPARTMENT_OTHER)
Admission: EM | Admit: 2018-07-27 | Discharge: 2018-07-27 | Disposition: A | Discharge status: Home / Self Care | Payer: Managed Care, Other (non HMO) | Attending: Emergency Medicine | Admitting: Emergency Medicine

## 2018-07-27 ENCOUNTER — Other Ambulatory Visit: Payer: Self-pay

## 2018-07-27 ENCOUNTER — Encounter (HOSPITAL_BASED_OUTPATIENT_CLINIC_OR_DEPARTMENT_OTHER): Payer: Self-pay | Admitting: *Deleted

## 2018-07-27 DIAGNOSIS — Z87891 Personal history of nicotine dependence: Secondary | ICD-10-CM | POA: Diagnosis not present

## 2018-07-27 DIAGNOSIS — L03211 Cellulitis of face: Secondary | ICD-10-CM | POA: Diagnosis not present

## 2018-07-27 DIAGNOSIS — L0291 Cutaneous abscess, unspecified: Secondary | ICD-10-CM

## 2018-07-27 DIAGNOSIS — L0201 Cutaneous abscess of face: Secondary | ICD-10-CM | POA: Insufficient documentation

## 2018-07-27 MED ORDER — DOXYCYCLINE HYCLATE 100 MG PO TABS
100.0000 mg | ORAL_TABLET | Freq: Once | ORAL | Status: AC
  Administered 2018-07-27: 100 mg via ORAL
  Filled 2018-07-27: qty 1

## 2018-07-27 MED ORDER — DOXYCYCLINE HYCLATE 100 MG PO CAPS: 100.0000 mg | ORAL_CAPSULE | Freq: Two times a day (BID) | ORAL | 0 refills | Status: DC

## 2018-07-27 MED ORDER — CEPHALEXIN 500 MG PO CAPS: 500.0000 mg | ORAL_CAPSULE | Freq: Four times a day (QID) | ORAL | 0 refills | Status: DC

## 2018-07-27 MED ORDER — CEPHALEXIN 250 MG PO CAPS
500.0000 mg | ORAL_CAPSULE | Freq: Once | ORAL | Status: AC
  Administered 2018-07-27: 500 mg via ORAL
  Filled 2018-07-27: qty 2

## 2018-07-27 NOTE — ED Triage Notes (Signed)
Abscess on the left side of his face by his ear.

## 2018-07-27 NOTE — ED Provider Notes (Signed)
[facility]PART[facility] Provider Note   CSN: [id number] Arrival date & time: 07/27/18  2222     History   Chief Complaint Chief Complaint  Patient presents with  . Abscess    HPI Vincent Cook is a 53 y.o. male.     HPI 53 year old male comes in a chief complaint of abscess. Patient has had history of prior abscess to his face because of folliculitis.  He reports that he started noticing swelling to the right side of his face 2 or 3 days ago.  Over time the swelling has gotten worse and he has increased pain.  He denies any nausea, vomiting, fevers, chills.  He reports that in the past he had symptoms on the right side of his face and it required multiple ED visits and incision and drainage.  He denies any history of diabetes and is immunocompetent.  Past Medical History:  Diagnosis Date  . Eczema   . Erectile dysfunction 2018  . Pneumonia 2008   [facility], 3 day hospitalization    Patient Active Problem List   Diagnosis Date Noted  . Screening for prostate cancer 07/07/2018  . Sleep disturbance 07/07/2018  . Snoring 07/07/2018  . Erectile dysfunction 07/07/2018  . Witnessed apneic spells 07/07/2018  . Fatigue 07/07/2018  . Vaccine counseling 07/07/2018  . Encounter for hepatitis C screening test for low risk patient 07/07/2018    Past Surgical History:  Procedure Laterality Date  . KNEE SURGERY     left, ligament repair, Dr. Telford Nab  . TONSILLECTOMY          Home Medications    Prior to Admission medications   Medication Sig Start Date End Date Taking? Authorizing Provider  sildenafil (VIAGRA) 100 MG tablet Take 1 tablet (100 mg total) by mouth as needed for erectile dysfunction. 07/09/18 07/09/19 Yes Tysinger, Camelia Eng, PA-C  cephALEXin (KEFLEX) 500 MG capsule Take 1 capsule (500 mg total) by mouth 4 (four) times daily. 07/27/18   Varney Biles, MD  doxycycline (VIBRAMYCIN) 100 MG capsule Take 1 capsule (100 mg total) by mouth 2  (two) times daily. 07/27/18   Varney Biles, MD    Family History Family History  Problem Relation Age of Onset  . Diabetes Mother   . Other Father        natural causes  . Diabetes Brother   . Cancer Maternal Uncle        prostate  . Heart disease Neg Hx   . Hypertension Neg Hx   . Stroke Neg Hx     Social History Social History   Tobacco Use  . Smoking status: Former Smoker    Packs/day: 1.00    Years: 8.00    Pack years: 8.00    Quit date: 02/25/2016    Years since quitting: 2.4  . Smokeless tobacco: Never Used  Substance Use Topics  . Alcohol use: Yes    Alcohol/week: 6.0 standard drinks    Types: 6 Cans of beer per week  . Drug use: Not Currently     Allergies   Patient has no known allergies.   Review of Systems Review of Systems  Constitutional: Positive for activity change. Negative for fever.  HENT: Negative for drooling and sore throat.   Gastrointestinal: Negative for nausea and vomiting.  Skin: Positive for rash.  Allergic/Immunologic: Negative for immunocompromised state.     Physical Exam Updated Vital Signs BP 121/74   Pulse 86   Temp 98.5 F (36.9 C) (  Oral)   Resp 18   Ht 5' 10.5" (1.791 m)   Wt 96.1 kg   SpO2 98%   BMI 29.97 kg/m   Physical Exam Vitals signs and nursing note reviewed.  Constitutional:      Appearance: He is well-developed.  HENT:     Head: Atraumatic.     Mouth/Throat:     Comments: Left-sided periauricular edema with mild induration.  No fluctuance appreciated, no significant erythema or pustules appreciated Neck:     Musculoskeletal: Neck supple.  Cardiovascular:     Rate and Rhythm: Normal rate.  Pulmonary:     Effort: Pulmonary effort is normal.  Skin:    General: Skin is warm.  Neurological:     Mental Status: He is alert and oriented to person, place, and time.      ED Treatments / Results  Labs (all labs ordered are listed, but only abnormal results are displayed) Labs Reviewed - No data  to display  EKG    Radiology No results found.  Procedures Ultrasound ED Soft Tissue  Date/Time: 07/27/2018 11:39 PM Performed by: Derwood KaplanNanavati, Alveta Quintela, MD Authorized by: Derwood KaplanNanavati, Vu Liebman, MD   Procedure details:    Indications: localization of abscess and evaluate for cellulitis     Transverse view:  Visualized   Longitudinal view:  Visualized   Images: archived   Location:    Location: face     Side:  Left Findings:     no abscess present    cellulitis present    no foreign body present   (including critical care time)  Medications Ordered in ED Medications  doxycycline (VIBRA-TABS) tablet 100 mg (has no administration in time range)  cephALEXin (KEFLEX) capsule 500 mg (has no administration in time range)     Initial Impression / Assessment and Plan / ED Course  I have reviewed the triage vital signs and the nursing notes.  Pertinent labs & imaging results that were available during my care of the patient were reviewed by me and considered in my medical decision making (see chart for details).        53 year old male who is immunocompetent comes in a chief complaint of facial swelling.  He has history of facial abscess on the right side, comes in this time with 2 days of left-sided jaw pain and swelling.  He has visible swelling just anterior to the pinna.  Differential diagnosis includes lymphadenitis, folliculitis, phlegmonous cellulitis, abscess.  Ultrasound performed and we do not see any abscess pocket.  Visits from prior folliculitis reviewed and it appeared that patient at that time also had phlegmonous state that evolved into an abscess eventually.  We will start him on Keflex and doxycycline right now and advised him to follow-up with his PCP.  Strict ER return precautions also discussed.  Final Clinical Impressions(s) / ED Diagnoses   Final diagnoses:  Facial cellulitis  Phlegmonous cellulitis    ED Discharge Orders         Ordered    doxycycline  (VIBRAMYCIN) 100 MG capsule  2 times daily     07/27/18 2333    cephALEXin (KEFLEX) 500 MG capsule  4 times daily     07/27/18 2333           Derwood KaplanNanavati, Tamkia Temples, MD 07/27/18 2339

## 2018-07-27 NOTE — Discharge Instructions (Addendum)
We signed the ER for facial swelling.  There is no clear evidence of abscess, that you likely are in a pre-abscess state.  We are starting you on antibiotics we recommend that you start applying warm compresses to the site.  We anticipate that the symptoms should start improving in 2 to 3 days.  See your primary care doctor if your symptoms are not improving.   Return to the ER if you start having any significant swelling, worsening pain, fevers, chills, severe headaches, pus drainage.

## 2018-07-27 NOTE — ED Notes (Signed)
ED Provider at bedside. 

## 2018-09-07 ENCOUNTER — Encounter (HOSPITAL_BASED_OUTPATIENT_CLINIC_OR_DEPARTMENT_OTHER): Payer: Self-pay | Admitting: Emergency Medicine

## 2018-09-07 ENCOUNTER — Emergency Department (HOSPITAL_BASED_OUTPATIENT_CLINIC_OR_DEPARTMENT_OTHER)
Admission: EM | Admit: 2018-09-07 | Discharge: 2018-09-07 | Disposition: A | Discharge status: Home / Self Care | Payer: Managed Care, Other (non HMO) | Attending: Emergency Medicine | Admitting: Emergency Medicine

## 2018-09-07 ENCOUNTER — Other Ambulatory Visit: Payer: Self-pay

## 2018-09-07 DIAGNOSIS — Z87891 Personal history of nicotine dependence: Secondary | ICD-10-CM | POA: Insufficient documentation

## 2018-09-07 DIAGNOSIS — Z79899 Other long term (current) drug therapy: Secondary | ICD-10-CM | POA: Diagnosis not present

## 2018-09-07 DIAGNOSIS — M722 Plantar fascial fibromatosis: Secondary | ICD-10-CM | POA: Insufficient documentation

## 2018-09-07 DIAGNOSIS — M79672 Pain in left foot: Secondary | ICD-10-CM | POA: Diagnosis present

## 2018-09-07 MED ORDER — IBUPROFEN 600 MG PO TABS: 600.0000 mg | ORAL_TABLET | Freq: Four times a day (QID) | ORAL | 0 refills | Status: DC | PRN

## 2018-09-07 NOTE — ED Provider Notes (Signed)
[facility] EMERGENCY DEPARTMENT Provider Note   CSN: [id number] Arrival date & time: 09/07/18  1205     History   Chief Complaint Chief Complaint  Patient presents with  . Foot Pain    HPI Vincent Cook is a 53 y.o. male with history of eczema, ED who presents with a 3-day history of left heel pain.  He denies any injury.  It is worse with ambulation.  States he is on his feet with lots of walking for 12 hours for work.  His pain is worse when getting up, but for some throughout the day.  He has not tried anything at home for symptoms.  He reports he has been wearing worn-out steel toed boots.  Denies any fever or numbness or tingling.     HPI  Past Medical History:  Diagnosis Date  . Eczema   . Erectile dysfunction 2018  . Pneumonia 2008   [facility], 3 day hospitalization    Patient Active Problem List   Diagnosis Date Noted  . Screening for prostate cancer 07/07/2018  . Sleep disturbance 07/07/2018  . Snoring 07/07/2018  . Erectile dysfunction 07/07/2018  . Witnessed apneic spells 07/07/2018  . Fatigue 07/07/2018  . Vaccine counseling 07/07/2018  . Encounter for hepatitis C screening test for low risk patient 07/07/2018    Past Surgical History:  Procedure Laterality Date  . KNEE SURGERY     left, ligament repair, Dr. Priscille Kluverendall  . TONSILLECTOMY          Home Medications    Prior to Admission medications   Medication Sig Start Date End Date Taking? Authorizing Provider  ibuprofen (ADVIL) 600 MG tablet Take 1 tablet (600 mg total) by mouth every 6 (six) hours as needed. 09/07/18   Denene Alamillo, Waylan BogaAlexandra M, PA-C  sildenafil (VIAGRA) 100 MG tablet Take 1 tablet (100 mg total) by mouth as needed for erectile dysfunction. 07/09/18 07/09/19  Tysinger, Kermit Baloavid S, PA-C  triamcinolone cream (KENALOG) 0.1 % APPLY A THIN LAYER TO AFFECTED AREA(S) TOPICALLY TWICE A DAY 08/17/18   [provider]    Family History Family History  Problem Relation Age of  Onset  . Diabetes Mother   . Other Father        natural causes  . Diabetes Brother   . Cancer Maternal Uncle        prostate  . Heart disease Neg Hx   . Hypertension Neg Hx   . Stroke Neg Hx     Social History Social History   Tobacco Use  . Smoking status: Former Smoker    Packs/day: 1.00    Years: 8.00    Pack years: 8.00    Quit date: 02/25/2016    Years since quitting: 2.5  . Smokeless tobacco: Never Used  Substance Use Topics  . Alcohol use: Yes    Alcohol/week: 6.0 standard drinks    Types: 6 Cans of beer per week  . Drug use: Not Currently     Allergies   Patient has no known allergies.   Review of Systems Review of Systems  Constitutional: Negative for fever.  Musculoskeletal: Positive for myalgias. Negative for joint swelling.  Neurological: Negative for numbness.     Physical Exam Updated Vital Signs BP 125/83   Pulse 64   Temp 98.5 F (36.9 C) (Oral)   Resp 16   Ht 5\' 10"  (1.778 m)   Wt 95.3 kg   SpO2 96%   BMI 30.13 kg/m   Physical  Exam Vitals signs and nursing note reviewed.  Constitutional:      General: He is not in acute distress.    Appearance: He is well-developed. He is not diaphoretic.  HENT:     Head: Normocephalic and atraumatic.     Mouth/Throat:     Pharynx: No oropharyngeal exudate.  Eyes:     General: No scleral icterus.       Right eye: No discharge.        Left eye: No discharge.     Conjunctiva/sclera: Conjunctivae normal.  Neck:     Musculoskeletal: Normal range of motion and neck supple.     Thyroid: No thyromegaly.  Cardiovascular:     Rate and Rhythm: Normal rate and regular rhythm.     Heart sounds: Normal heart sounds. No murmur. No friction rub. No gallop.   Pulmonary:     Effort: Pulmonary effort is normal. No respiratory distress.     Breath sounds: Normal breath sounds. No stridor. No wheezing or rales.  Musculoskeletal:     Comments: Tenderness at the left heel of the plantar fascia; some tightness  on palpation; sensation intact; no bony tenderness; DP and PT pulses intact  Lymphadenopathy:     Cervical: No cervical adenopathy.  Skin:    General: Skin is warm and dry.     Coloration: Skin is not pale.     Findings: No rash.  Neurological:     Mental Status: He is alert.     Coordination: Coordination normal.      ED Treatments / Results  Labs (all labs ordered are listed, but only abnormal results are displayed) Labs Reviewed - No data to display  EKG None  Radiology No results found.  Procedures Procedures (including critical care time)  Medications Ordered in ED Medications - No data to display   Initial Impression / Assessment and Plan / ED Course  I have reviewed the triage vital signs and the nursing notes.  Pertinent labs & imaging results that were available during my care of the patient were reviewed by me and considered in my medical decision making (see chart for details).        Patient presenting with suspected plantar fasciitis of the left foot, probably from walking in worn-out boots a lot.  No injury or trauma, or bony tenderness, indicating imaging at this time.  Advised orthotics as well as stretching, massage, ice, NSAIDs.  Follow-up to podiatry as needed.  Return precautions discussed.  Patient understands and agrees with plan.  Patient vitals stable throughout ED course and discharged in satisfactory condition.  Final Clinical Impressions(s) / ED Diagnoses   Final diagnoses:  Plantar fasciitis of left foot    ED Discharge Orders         Ordered    ibuprofen (ADVIL) 600 MG tablet  Every 6 hours PRN     09/07/18 [address] [place], PA-C 09/07/18 1334    Veryl Speak, MD 09/08/18 [phone]

## 2018-09-07 NOTE — ED Triage Notes (Signed)
Foot pain to left heel x 3 days.  No injury.  Pain worse when first getting up but improves some.   Painful ambulation.

## 2018-09-07 NOTE — Discharge Instructions (Signed)
Take ibuprofen every 6 hours as prescribed, as needed for your pain.  Freeze a water bottle or juice can and ice while massaging your foot 3-4 times daily alternating 20 minutes on, 20 minutes off.  Attempt stretches we discussed a few times daily.  Make sure to wear shoes with good arch support or by orthotics for plantar fasciitis.  If you continue to have problems despite these interventions, please follow-up with Dr. Jacqualyn Posey, the podiatrist.  Please return the emergency department if you develop any new or worsening symptoms.

## 2018-09-08 ENCOUNTER — Other Ambulatory Visit: Payer: Self-pay | Admitting: Medical

## 2018-09-08 NOTE — Telephone Encounter (Signed)
[facility] is requesting to fill pt sildenafil. Shane pt . Please advise [facility]

## 2018-09-09 ENCOUNTER — Other Ambulatory Visit: Payer: Self-pay | Admitting: Medical

## 2018-09-09 NOTE — Telephone Encounter (Signed)
[facility] is requesting to fill pt sildenafil. Please advise [facility] 

## 2018-09-13 ENCOUNTER — Telehealth: Payer: Self-pay | Admitting: Medical

## 2018-09-13 NOTE — Telephone Encounter (Signed)
Please get in touch with the patient about recent visit to the emergency department.  Remind them to use us first for non emergency issues.  The emergency dept is expensive, should be used primarily for emergencies, and they should be encouraged to come here for non emergency problems.  This saves every body money, saves them time, and is better for continuity of care.  If they are ever unsure, they can always call here first, unless after hours or weekends.   

## 2018-09-24 ENCOUNTER — Encounter (HOSPITAL_COMMUNITY): Payer: Self-pay

## 2018-09-24 ENCOUNTER — Other Ambulatory Visit: Payer: Self-pay

## 2018-09-24 ENCOUNTER — Ambulatory Visit (HOSPITAL_COMMUNITY)
Admission: EM | Admit: 2018-09-24 | Discharge: 2018-09-24 | Disposition: A | Discharge status: Home / Self Care | Payer: Managed Care, Other (non HMO) | Attending: Urgent Care | Admitting: Urgent Care

## 2018-09-24 DIAGNOSIS — Z113 Encounter for screening for infections with a predominantly sexual mode of transmission: Secondary | ICD-10-CM

## 2018-09-24 DIAGNOSIS — Z202 Contact with and (suspected) exposure to infections with a predominantly sexual mode of transmission: Secondary | ICD-10-CM | POA: Insufficient documentation

## 2018-09-24 MED ORDER — AZITHROMYCIN 250 MG PO TABS
1000.0000 mg | ORAL_TABLET | Freq: Once | ORAL | Status: AC
  Administered 2018-09-24: 1000 mg via ORAL

## 2018-09-24 MED ORDER — CEFTRIAXONE SODIUM 250 MG IJ SOLR
INTRAMUSCULAR | Status: AC
  Filled 2018-09-24: qty 250

## 2018-09-24 MED ORDER — AZITHROMYCIN 250 MG PO TABS
ORAL_TABLET | ORAL | Status: AC
  Filled 2018-09-24: qty 4

## 2018-09-24 MED ORDER — CEFTRIAXONE SODIUM 250 MG IJ SOLR
250.0000 mg | Freq: Once | INTRAMUSCULAR | Status: AC
  Administered 2018-09-24: 20:00:00 250 mg via INTRAMUSCULAR

## 2018-09-24 NOTE — Discharge Instructions (Addendum)
Avoid all forms of sexual intercourse (oral, vaginal, anal) for the next 7 days to avoid spreading/reinfecting. Return if symptoms worsen/do not resolve, you develop fever, abdominal pain, blood in your urine, or are re-exposed to an STI.  

## 2018-09-24 NOTE — ED Triage Notes (Signed)
Patient presents to Urgent Care with complaints of needing testing for chlamydia since his partner told him the day before yesterday that she thought she had it(does not know for sure). Patient reports he has no symptoms.

## 2018-09-24 NOTE — ED Provider Notes (Signed)
  MRN: [id number] DOB: 08/25/1965  Subjective:   Vincent Cook is a 53 y.o. male presenting for exposure to chlamydia.  Patient states that he does not have any symptoms and would like to get treated. Denies dysuria, hematuria, urinary frequency, penile discharge, penile swelling, testicular pain, testicular swelling, anal pain, groin pain.    Current Facility-Administered Medications:  .  azithromycin (ZITHROMAX) tablet 1,000 mg, 1,000 mg, Oral, Once, Jaynee Eagles, PA-C .  cefTRIAXone (ROCEPHIN) injection 250 mg, 250 mg, Intramuscular, Once, Jaynee Eagles, [place]  Current Outpatient Medications:  .  ibuprofen (ADVIL) 600 MG tablet, Take 1 tablet (600 mg total) by mouth every 6 (six) hours as needed., Disp: 30 tablet, Rfl: 0 .  sildenafil (VIAGRA) 100 MG tablet, TAKE ONE TABLET BY MOUTH DAILY AS NEEDED FOR FOR ERECTILE DYSFUNCTION, Disp: 20 tablet, Rfl: 5 .  triamcinolone cream (KENALOG) 0.1 %, APPLY A THIN LAYER TO AFFECTED AREA(S) TOPICALLY TWICE A DAY, Disp: , Rfl:    No Known Allergies  Past Medical History:  Diagnosis Date  . Eczema   . Erectile dysfunction 2018  . Pneumonia 2008   [facility], 3 day hospitalization     Past Surgical History:  Procedure Laterality Date  . KNEE SURGERY     left, ligament repair, Dr. Telford Nab  . TONSILLECTOMY      ROS  Objective:   Vitals: BP 126/87 (BP Location: Left Arm)   Pulse 76   Temp 98.4 F (36.9 C) (Oral)   Resp 16   SpO2 97%   Physical Exam Constitutional:      Appearance: Normal appearance. He is well-developed and normal weight.  HENT:     Head: Normocephalic and atraumatic.     Right Ear: External ear normal.     Left Ear: External ear normal.     Nose: Nose normal.     Mouth/Throat:     Pharynx: Oropharynx is clear.  Eyes:     Extraocular Movements: Extraocular movements intact.     Pupils: Pupils are equal, round, and reactive to light.  Cardiovascular:     Rate and Rhythm: Normal rate.  Pulmonary:   Effort: Pulmonary effort is normal.  Genitourinary:    Penis: Circumcised. No phimosis, paraphimosis, hypospadias, erythema, tenderness, discharge, swelling or lesions.      Scrotum/Testes:        Right: Mass, tenderness or swelling not present.        Left: Mass, tenderness or swelling not present.     Epididymis:     Right: Normal.     Left: Normal.  Neurological:     Mental Status: He is alert and oriented to person, place, and time.  Psychiatric:        Mood and Affect: Mood normal.        Behavior: Behavior normal.     Assessment and Plan :   1. Exposure to chlamydia   2. STD exposure     We will treat empirically per CDC guidelines for gonorrhea and chlamydia with IM ceftriaxone and azithromycin in clinic.  Counseled on safe sex practices including abstaining for 1 week following treatment.  Counseled patient on potential for adverse effects with medications prescribed/recommended today, ER and return-to-clinic precautions discussed, patient verbalized understanding.    Jaynee Eagles, PA-C 09/24/18 2014

## 2018-09-26 LAB — CYTOLOGY, (ORAL, ANAL, URETHRAL) ANCILLARY ONLY
Chlamydia: NEGATIVE
Neisseria Gonorrhea: NEGATIVE
Trichomonas: NEGATIVE

## 2018-10-09 ENCOUNTER — Other Ambulatory Visit: Payer: Self-pay | Admitting: Family Medicine

## 2018-11-30 ENCOUNTER — Telehealth: Payer: Self-pay

## 2018-11-30 ENCOUNTER — Telehealth: Payer: Self-pay | Admitting: Medical

## 2018-11-30 ENCOUNTER — Other Ambulatory Visit: Payer: Self-pay | Admitting: Medical

## 2018-11-30 MED ORDER — SILDENAFIL CITRATE 100 MG PO TABS: ORAL_TABLET | ORAL | 2 refills | Status: DC

## 2018-11-30 NOTE — Telephone Encounter (Signed)
Unable to leave message on machine for patient to return call.

## 2018-11-30 NOTE — Telephone Encounter (Signed)
Pt. Called stating he needs a refill on his Sildenafil but wasn't sure if he needs to switch his pharmacy would like to discuss with Shane's nurse.

## 2018-11-30 NOTE — Telephone Encounter (Signed)
Pt called for refills of sildenafil. Please send to [facility]

## 2018-12-01 NOTE — Telephone Encounter (Signed)
Called patient and he stated this was already taken care of.

## 2018-12-16 ENCOUNTER — Other Ambulatory Visit: Payer: Self-pay

## 2018-12-16 DIAGNOSIS — Z20822 Contact with and (suspected) exposure to covid-19: Secondary | ICD-10-CM

## 2018-12-17 LAB — NOVEL CORONAVIRUS, NAA: SARS-CoV-2, NAA: NOT DETECTED

## 2019-02-05 ENCOUNTER — Ambulatory Visit: Payer: Managed Care, Other (non HMO) | Attending: Internal Medicine

## 2019-02-05 DIAGNOSIS — Z20822 Contact with and (suspected) exposure to covid-19: Secondary | ICD-10-CM

## 2019-02-06 LAB — NOVEL CORONAVIRUS, NAA: SARS-CoV-2, NAA: NOT DETECTED

## 2019-02-08 ENCOUNTER — Telehealth: Payer: Self-pay

## 2019-02-08 NOTE — Telephone Encounter (Signed)
Pt notified of negative COVID-19 results. Understanding verbalized.  Chasta M Hopkins   

## 2019-02-22 ENCOUNTER — Other Ambulatory Visit: Payer: Self-pay | Admitting: Medical

## 2019-02-23 NOTE — Telephone Encounter (Signed)
Is this ok to refill?  

## 2019-02-25 ENCOUNTER — Other Ambulatory Visit: Payer: Self-pay | Admitting: Medical

## 2019-03-05 ENCOUNTER — Ambulatory Visit: Payer: Managed Care, Other (non HMO) | Admitting: Medical

## 2019-03-05 ENCOUNTER — Encounter: Payer: Self-pay | Admitting: Medical

## 2019-03-05 ENCOUNTER — Other Ambulatory Visit: Payer: Self-pay

## 2019-03-05 VITALS — BP 110/80 | HR 75 | Temp 97.8°F | Ht 70.0 in | Wt 206.0 lb

## 2019-03-05 DIAGNOSIS — R339 Retention of urine, unspecified: Secondary | ICD-10-CM | POA: Diagnosis not present

## 2019-03-05 DIAGNOSIS — R21 Rash and other nonspecific skin eruption: Secondary | ICD-10-CM | POA: Diagnosis not present

## 2019-03-05 DIAGNOSIS — R39198 Other difficulties with micturition: Secondary | ICD-10-CM | POA: Diagnosis not present

## 2019-03-05 LAB — POCT URINALYSIS DIP (PROADVANTAGE DEVICE)
Bilirubin, UA: NEGATIVE
Blood, UA: NEGATIVE
Glucose, UA: NEGATIVE mg/dL
Ketones, POC UA: NEGATIVE mg/dL
Leukocytes, UA: NEGATIVE
Nitrite, UA: NEGATIVE
Protein Ur, POC: NEGATIVE mg/dL
Specific Gravity, Urine: 1.02
Urobilinogen, Ur: NEGATIVE
pH, UA: 6 (ref 5.0–8.0)

## 2019-03-05 MED ORDER — TAMSULOSIN HCL 0.4 MG PO CAPS: 0.4000 mg | ORAL_CAPSULE | Freq: Every day | ORAL | 2 refills | Status: DC

## 2019-03-05 MED ORDER — TERBINAFINE HCL 1 % EX CREA: 1.0000 "application " | TOPICAL_CREAM | Freq: Two times a day (BID) | CUTANEOUS | 0 refills | Status: DC

## 2019-03-05 NOTE — Progress Notes (Signed)
Subjective: Chief Complaint  Patient presents with  . Prostate Check    Here concerned about prostate.  Urine flow has been changing for the past 1 to 2 weeks.   Urine flow seems to be slower than in past. No dribbling at the end.  No nocturia or no urine during sleep.  Works night shift.  No blood from penis, no penile discharge.   No cloudy or dark urine.  Once he starts urinating, stream can stop at times.  He notes that his brother who is older than him had to have his urethra stretched 1 time because of a similar issue.  He denies fever, nausea, no bowel issues, otherwise feels fine.  No concern for STD, same partner times several year.   Has rash in scrotal area times a few weeks under the scrotum, itchy.  Using his eczema triamcinolone cream for this area.   Past Medical History:  Diagnosis Date  . Eczema   . Erectile dysfunction 2018  . Pneumonia 2008   [name], 3 day hospitalization   Current Outpatient Medications on File Prior to Visit  Medication Sig Dispense Refill  . sildenafil (VIAGRA) 100 MG tablet TAKE ONE TABLET BY MOUTH DAILY AS NEEDED FOR FOR ERECTILE DYSFUNCTION 20 tablet 1  . triamcinolone cream (KENALOG) 0.1 % APPLY A THIN LAYER TO AFFECTED AREA(S) TOPICALLY TWICE A DAY    . ibuprofen (ADVIL) 600 MG tablet Take 1 tablet (600 mg total) by mouth every 6 (six) hours as needed. (Patient not taking: Reported on 03/05/2019) 30 tablet 0   No current facility-administered medications on file prior to visit.   ROS as in subjective     Objective: BP 110/80   Pulse 75   Temp 97.8 F (36.6 C)   Ht 5\' 10"  (1.778 m)   Wt 206 lb (93.4 kg)   SpO2 98%   BMI 29.56 kg/m   Gen: wd, wn, nad African American male Abdomen: +bs, soft, nontender, no mass, no organomegaly male, circumcised, no testicular mass or tenderness, no swelling, possible shotty slight nodes palpated nontender in the right inguinal region DRE: Prostate within normal limits, not boggy, no  nodules, anus normal tone Skin: There is a small 2 cm x 3 cm oval rough pinkish rash in the perineal region with linear involvement down towards the anus suggestive of fungus    Assessment: Encounter Diagnoses  Name Primary?  . Decreased urine stream Yes  . Rash   . Urine retention      Plan: We discussed his symptoms and concerns.  Interestingly his brother had to have his urethra stretched at 1 point for similar reasons.  We discussed the recommendations below.  Follow-up in 2 to 4 weeks  Recommendations:  Your symptoms today suggest urinary retention which can be caused by several factors  Since you do not have any obvious signs of infection and your urine test was normal, begin trial of Flomax 1 tablet daily.  Flomax relaxes the smooth muscle in the bladder tract which should help improve your symptoms with urination  Begin Lamisil cream for the rash in the scrotal area x2 to 4 weeks  If either issue is not improving over the next 2-3 weeks then let me know  If you have any new symptoms such as fever, belly pain, nausea, and easy belly or pelvic pain, blood in the urine, penile discharge or other symptoms then let me know right away   Vincent Cook was seen today for prostate  check.  Diagnoses and all orders for this visit:  Decreased urine stream  Rash  Urine retention  Other orders -     tamsulosin (FLOMAX) 0.4 MG CAPS capsule; Take 1 capsule (0.4 mg total) by mouth daily. -     terbinafine (LAMISIL AT) 1 % cream; Apply 1 application topically 2 (two) times daily.

## 2019-03-05 NOTE — Patient Instructions (Signed)
Recommendations:  Your symptoms today suggest urinary retention which can be caused by several factors  Since you do not have any obvious signs of infection and your urine test was normal, begin trial of Flomax 1 tablet daily.  Flomax relaxes the smooth muscle in the bladder tract which should help improve your symptoms with urination  Begin Lamisil cream for the rash in the scrotal area x2 to 4 weeks  If either issue is not improving over the next 2-3 weeks then let me know  If you have any new symptoms such as fever, belly pain, nausea, and easy belly or pelvic pain, blood in the urine, penile discharge or other symptoms then let me know right away    Acute Urinary Retention, Male  Acute urinary retention means that you cannot pee (urinate) at all, or that you pee too little and your bladder is not emptied completely. If it is not treated, it can lead to kidney damage or other serious problems. Follow these instructions at home:  Take over-the-counter and prescription medicines only as told by your doctor. Ask your doctor what medicines you should stay away from. Do not take any medicine unless your doctor says it is okay to do so.  If you were sent home with a tube that drains the bladder (catheter), take care of it as told by your doctor.  Drink enough fluid to keep your pee clear or pale yellow.  If you were given an antibiotic, take it as told by your doctor. Do not stop taking the antibiotic even if you start to feel better.  Do not use any products that contain nicotine or tobacco, such as cigarettes and e-cigarettes. If you need help quitting, ask your doctor.  Watch for changes in your symptoms. Tell your doctor about them.  If told, track changes in your blood pressure at home. Tell your doctor about them.  Keep all follow-up visits as told by your doctor. This is important. Contact a doctor if:  You have spasms or you leak pee when you have spasms. Get help right  away if:  You have chills or a fever.  You have a tube that drains the bladder and: ? The tube stops draining pee. ? The tube falls out.  You have blood in your pee. Summary  Acute urinary retention means that you have problems peeing. It may mean that you cannot pee at all, or that you pee too little.  If this condition is not treated, it can lead to kidney damage or other serious problems.  If you were sent home with a tube that drains the bladder, take care of it as told by your doctor.  Monitor any changes in your symptoms. Tell your doctor about any changes. This information is not intended to replace advice given to you by your health care provider. Make sure you discuss any questions you have with your health care provider. Document Revised: 04/02/2018 Document Reviewed: 02/16/2016 Elsevier Patient Education  2020 [employer].

## 2019-04-29 ENCOUNTER — Telehealth: Payer: Self-pay | Admitting: Medical

## 2019-04-29 MED ORDER — SILDENAFIL CITRATE 100 MG PO TABS: ORAL_TABLET | ORAL | 1 refills | Status: DC

## 2019-04-29 NOTE — Telephone Encounter (Signed)
Pt called and is requesting a refill on sildenafil pt would like it sent to the [facility] [address], [place] - [address] pt informed that shane  Was not in the office

## 2019-07-03 ENCOUNTER — Other Ambulatory Visit: Payer: Self-pay | Admitting: Medical

## 2019-07-30 ENCOUNTER — Other Ambulatory Visit: Payer: Self-pay | Admitting: Medical

## 2019-08-28 ENCOUNTER — Other Ambulatory Visit: Payer: Self-pay | Admitting: Medical

## 2019-08-30 NOTE — Telephone Encounter (Signed)
Pt is over due and sent a mychart message to call our office to schedule. Ok to refill?

## 2019-09-13 ENCOUNTER — Telehealth: Payer: Self-pay | Admitting: Medical

## 2019-09-13 NOTE — Telephone Encounter (Signed)
Dismissal letter in guarantor snapshot  °

## 2019-09-25 ENCOUNTER — Other Ambulatory Visit: Payer: Self-pay | Admitting: Medical

## 2019-09-28 ENCOUNTER — Other Ambulatory Visit: Payer: Self-pay | Admitting: Medical

## 2019-09-29 ENCOUNTER — Other Ambulatory Visit: Payer: Self-pay | Admitting: Medical

## 2019-09-29 ENCOUNTER — Telehealth: Payer: Self-pay | Admitting: Medical

## 2019-09-29 MED ORDER — SILDENAFIL CITRATE 100 MG PO TABS: 100.0000 mg | ORAL_TABLET | ORAL | 5 refills | Status: DC | PRN

## 2019-09-29 NOTE — Telephone Encounter (Signed)
Pt called requesting a refill on his sidenafil please send to the [facility] [address] [place] - [address]

## 2019-10-23 ENCOUNTER — Encounter (HOSPITAL_COMMUNITY): Payer: Self-pay

## 2019-10-23 ENCOUNTER — Other Ambulatory Visit: Payer: Self-pay

## 2019-10-23 ENCOUNTER — Ambulatory Visit (HOSPITAL_COMMUNITY)
Admission: EM | Admit: 2019-10-23 | Discharge: 2019-10-23 | Disposition: A | Discharge status: Home / Self Care | Payer: Managed Care, Other (non HMO) | Attending: Emergency Medicine | Admitting: Emergency Medicine

## 2019-10-23 DIAGNOSIS — Z20822 Contact with and (suspected) exposure to covid-19: Secondary | ICD-10-CM | POA: Diagnosis not present

## 2019-10-23 DIAGNOSIS — Z87891 Personal history of nicotine dependence: Secondary | ICD-10-CM | POA: Diagnosis not present

## 2019-10-23 DIAGNOSIS — R059 Cough, unspecified: Secondary | ICD-10-CM

## 2019-10-23 DIAGNOSIS — R05 Cough: Secondary | ICD-10-CM | POA: Diagnosis not present

## 2019-10-23 DIAGNOSIS — R197 Diarrhea, unspecified: Secondary | ICD-10-CM | POA: Insufficient documentation

## 2019-10-23 LAB — SARS CORONAVIRUS 2 (TAT 6-24 HRS): SARS Coronavirus 2: NEGATIVE

## 2019-10-23 MED ORDER — BENZONATATE 200 MG PO CAPS: 200.0000 mg | ORAL_CAPSULE | Freq: Three times a day (TID) | ORAL | 0 refills | Status: DC | PRN

## 2019-10-23 MED ORDER — PROMETHAZINE-DM 6.25-15 MG/5ML PO SYRP: 5.0000 mL | ORAL_SOLUTION | Freq: Four times a day (QID) | ORAL | 0 refills | Status: DC | PRN

## 2019-10-23 NOTE — ED Provider Notes (Signed)
[facility]    CSN: [id number] Arrival date & time: 10/23/19  1545      History   Chief Complaint Chief Complaint  Patient presents with   Cough   Headache   Diarrhea    HPI Vincent Cook is a 54 y.o. male.   Headache, diarrhea, cough x 1 day. Denies fever, chills, body aches, CP, SOB, vomiting, diarrhea. Taking ibuprofen without relief. No sick contacts, recent travel, asthma, COPD. Not UTD on COVID vaccines.      Past Medical History:  Diagnosis Date   Eczema    Erectile dysfunction 2018   Pneumonia 2008   [place], 3 day hospitalization    Patient Active Problem List   Diagnosis Date Noted   Screening for prostate cancer 07/07/2018   Sleep disturbance 07/07/2018   Snoring 07/07/2018   Erectile dysfunction 07/07/2018   Witnessed apneic spells 07/07/2018   Fatigue 07/07/2018   Vaccine counseling 07/07/2018   Encounter for hepatitis C screening test for low risk patient 07/07/2018    Past Surgical History:  Procedure Laterality Date   KNEE SURGERY     left, ligament repair, Dr. Priscille Kluver   TONSILLECTOMY         Home Medications    Prior to Admission medications   Medication Sig Start Date End Date Taking? Authorizing Provider  benzonatate (TESSALON) 200 MG capsule Take 1 capsule (200 mg total) by mouth 3 (three) times daily as needed for cough. 10/23/19   Particia Nearing, PA-C  ibuprofen (ADVIL) 600 MG tablet Take 1 tablet (600 mg total) by mouth every 6 (six) hours as needed. 09/07/18   Law, Waylan Boga, PA-C  promethazine-dextromethorphan (PROMETHAZINE-DM) 6.25-15 MG/5ML syrup Take 5 mLs by mouth 4 (four) times daily as needed for cough. 10/23/19   Particia Nearing, PA-C  sildenafil (VIAGRA) 100 MG tablet Take 1 tablet (100 mg total) by mouth as needed for erectile dysfunction. 09/29/19   Tysinger, Kermit Balo, PA-C  tamsulosin (FLOMAX) 0.4 MG CAPS capsule Take 1 capsule (0.4 mg total) by mouth daily. 03/05/19    Tysinger, Kermit Balo, PA-C  terbinafine (LAMISIL AT) 1 % cream Apply 1 application topically 2 (two) times daily. 03/05/19   Tysinger, Kermit Balo, PA-C  triamcinolone cream (KENALOG) 0.1 % APPLY A THIN LAYER TO AFFECTED AREA(S) TOPICALLY TWICE A DAY 08/17/18   [provider]    Family History Family History  Problem Relation Age of Onset   Diabetes Mother    Other Father        natural causes   Diabetes Brother    Cancer Maternal Uncle        prostate   Heart disease Neg Hx    Hypertension Neg Hx    Stroke Neg Hx     Social History Social History   Tobacco Use   Smoking status: Former Smoker    Packs/day: 1.00    Years: 8.00    Pack years: 8.00    Quit date: 02/25/2016    Years since quitting: 3.6   Smokeless tobacco: Never Used  Vaping Use   Vaping Use: Never used  Substance Use Topics   Alcohol use: Yes    Alcohol/week: 6.0 standard drinks    Types: 6 Cans of beer per week    Comment: socially   Drug use: Not Currently     Allergies   Patient has no known allergies.   Review of Systems Review of Systems PER HPI    Physical Exam  Triage Vital Signs ED Triage Vitals  Enc Vitals Group     BP      Pulse      Resp      Temp      Temp src      SpO2      Weight      Height      Head Circumference      Peak Flow      Pain Score      Pain Loc      Pain Edu?      Excl. in GC?    No data found.  Updated Vital Signs BP 137/86 (BP Location: Right Arm)    Pulse 77    Temp 97.6 F (36.4 C) (Oral)    Resp 18    SpO2 96%   Visual Acuity Right Eye Distance:   Left Eye Distance:   Bilateral Distance:    Right Eye Near:   Left Eye Near:    Bilateral Near:     Physical Exam Vitals and nursing note reviewed.  Constitutional:      Appearance: Normal appearance.  HENT:     Head: Atraumatic.     Nose: Nose normal.     Mouth/Throat:     Mouth: Mucous membranes are moist.     Pharynx: Posterior oropharyngeal erythema present.  Eyes:      Extraocular Movements: Extraocular movements intact.     Conjunctiva/sclera: Conjunctivae normal.  Cardiovascular:     Rate and Rhythm: Normal rate and regular rhythm.  Pulmonary:     Effort: Pulmonary effort is normal.     Breath sounds: Normal breath sounds.  Musculoskeletal:        General: Normal range of motion.     Cervical back: Normal range of motion and neck supple.  Skin:    General: Skin is warm and dry.  Neurological:     General: No focal deficit present.     Mental Status: He is oriented to person, place, and time.  Psychiatric:        Mood and Affect: Mood normal.        Thought Content: Thought content normal.        Judgment: Judgment normal.      UC Treatments / Results  Labs (all labs ordered are listed, but only abnormal results are displayed) Labs Reviewed  SARS CORONAVIRUS 2 (TAT 6-24 HRS)    EKG   Radiology No results found.  Procedures Procedures (including critical care time)  Medications Ordered in UC Medications - No data to display  Initial Impression / Assessment and Plan / UC Course  I have reviewed the triage vital signs and the nursing notes.  Pertinent labs & imaging results that were available during my care of the patient were reviewed by me and considered in my medical decision making (see chart for details).     Viral URI - COVID pcr pending, tessalon perles and phenergan DM sent for symptomatic relief. BRAT diet, push fluids. Isolation protocol reviewed and work note given. Supportive home care and return precautions reviewed.    Final Clinical Impressions(s) / UC Diagnoses   Final diagnoses:  Diarrhea, unspecified type  Cough   Discharge Instructions   None    ED Prescriptions    Medication Sig Dispense Auth. Provider   benzonatate (TESSALON) 200 MG capsule Take 1 capsule (200 mg total) by mouth 3 (three) times daily as needed for cough. 21 capsule Particia Nearing, [place]  promethazine-dextromethorphan  (PROMETHAZINE-DM) 6.25-15 MG/5ML syrup Take 5 mLs by mouth 4 (four) times daily as needed for cough. 100 mL Particia Nearing, [place]     PDMP not reviewed this encounter.   Particia Nearing, [place] 10/23/19 1719

## 2019-10-23 NOTE — ED Triage Notes (Signed)
Pt presents with diarrhea, cough and headache x 1 day. Ibuprofen gives no relief.

## 2019-12-08 ENCOUNTER — Encounter (HOSPITAL_COMMUNITY): Payer: Self-pay

## 2019-12-08 ENCOUNTER — Ambulatory Visit (HOSPITAL_COMMUNITY)
Admission: EM | Admit: 2019-12-08 | Discharge: 2019-12-08 | Disposition: A | Discharge status: Home / Self Care | Payer: Managed Care, Other (non HMO) | Attending: Physician Assistant | Admitting: Physician Assistant

## 2019-12-08 ENCOUNTER — Other Ambulatory Visit: Payer: Self-pay

## 2019-12-08 DIAGNOSIS — M25562 Pain in left knee: Secondary | ICD-10-CM

## 2019-12-08 MED ORDER — DICLOFENAC SODIUM 75 MG PO TBEC: 75.0000 mg | DELAYED_RELEASE_TABLET | Freq: Two times a day (BID) | ORAL | 0 refills | Status: DC

## 2019-12-08 NOTE — Discharge Instructions (Signed)
Return if any problems.

## 2019-12-08 NOTE — ED Provider Notes (Signed)
[facility]    CSN: [id number] Arrival date & time: 12/08/19  1446      History   Chief Complaint Chief Complaint  Patient presents with  . Knee Pain    HPI Vincent Cook is a 54 y.o. male.   The history is provided by the patient. No language interpreter was used.  Knee Pain Location:  Knee Injury: no   Knee location:  L knee Pain details:    Quality:  Aching   Severity:  Moderate   Onset quality:  Gradual   Timing:  Constant   Progression:  Worsening Chronicity:  New Relieved by:  Nothing Worsened by:  Nothing Ineffective treatments:  None tried Risk factors: no frequent fractures   Pt complains of knee pain.  Pt reports he has an appointment to see his MD in 2 days.  No injury.  Pt requesting something to help with dscomfort  Past Medical History:  Diagnosis Date  . Eczema   . Erectile dysfunction 2018  . Pneumonia 2008   [name], 3 day hospitalization    Patient Active Problem List   Diagnosis Date Noted  . Screening for prostate cancer 07/07/2018  . Sleep disturbance 07/07/2018  . Snoring 07/07/2018  . Erectile dysfunction 07/07/2018  . Witnessed apneic spells 07/07/2018  . Fatigue 07/07/2018  . Vaccine counseling 07/07/2018  . Encounter for hepatitis C screening test for low risk patient 07/07/2018    Past Surgical History:  Procedure Laterality Date  . KNEE SURGERY     left, ligament repair, Dr. Priscille Kluver  . TONSILLECTOMY         Home Medications    Prior to Admission medications   Medication Sig Start Date End Date Taking? Authorizing Provider  benzonatate (TESSALON) 200 MG capsule Take 1 capsule (200 mg total) by mouth 3 (three) times daily as needed for cough. 10/23/19   Particia Nearing, PA-C  diclofenac (VOLTAREN) 75 MG EC tablet Take 1 tablet (75 mg total) by mouth 2 (two) times daily. 12/08/19   Elson Areas, PA-C  ibuprofen (ADVIL) 600 MG tablet Take 1 tablet (600 mg total) by mouth every 6 (six) hours as  needed. 09/07/18   Law, Waylan Boga, PA-C  promethazine-dextromethorphan (PROMETHAZINE-DM) 6.25-15 MG/5ML syrup Take 5 mLs by mouth 4 (four) times daily as needed for cough. 10/23/19   Particia Nearing, PA-C  sildenafil (VIAGRA) 100 MG tablet Take 1 tablet (100 mg total) by mouth as needed for erectile dysfunction. 09/29/19   Tysinger, Kermit Balo, PA-C  tamsulosin (FLOMAX) 0.4 MG CAPS capsule Take 1 capsule (0.4 mg total) by mouth daily. 03/05/19   Tysinger, Kermit Balo, PA-C  terbinafine (LAMISIL AT) 1 % cream Apply 1 application topically 2 (two) times daily. 03/05/19   Tysinger, Kermit Balo, PA-C  triamcinolone cream (KENALOG) 0.1 % APPLY A THIN LAYER TO AFFECTED AREA(S) TOPICALLY TWICE A DAY 08/17/18   [provider]    Family History Family History  Problem Relation Age of Onset  . Diabetes Mother   . Other Father        natural causes  . Diabetes Brother   . Cancer Maternal Uncle        prostate  . Heart disease Neg Hx   . Hypertension Neg Hx   . Stroke Neg Hx     Social History Social History   Tobacco Use  . Smoking status: Former Smoker    Packs/day: 1.00    Years: 8.00  Pack years: 8.00    Quit date: 02/25/2016    Years since quitting: 3.7  . Smokeless tobacco: Never Used  Vaping Use  . Vaping Use: Never used  Substance Use Topics  . Alcohol use: Yes    Alcohol/week: 6.0 standard drinks    Types: 6 Cans of beer per week    Comment: socially  . Drug use: Not Currently     Allergies   Patient has no known allergies.   Review of Systems Review of Systems  All other systems reviewed and are negative.    Physical Exam Triage Vital Signs ED Triage Vitals  Enc Vitals Group     BP 12/08/19 1654 127/88     Pulse Rate 12/08/19 1654 84     Resp 12/08/19 1654 20     Temp --      Temp src --      SpO2 12/08/19 1654 99 %     Weight --      Height --      Head Circumference --      Peak Flow --      Pain Score 12/08/19 1653 7     Pain Loc --      Pain  Edu? --      Excl. in GC? --    No data found.  Updated Vital Signs BP 127/88 (BP Location: Left Arm)   Pulse 84   Resp 20   SpO2 99%   Visual Acuity Right Eye Distance:   Left Eye Distance:   Bilateral Distance:    Right Eye Near:   Left Eye Near:    Bilateral Near:     Physical Exam Vitals and nursing note reviewed.  Constitutional:      Appearance: He is well-developed.  HENT:     Head: Normocephalic and atraumatic.  Eyes:     Conjunctiva/sclera: Conjunctivae normal.  Cardiovascular:     Rate and Rhythm: Normal rate.  Pulmonary:     Effort: Pulmonary effort is normal. No respiratory distress.  Musculoskeletal:        General: Swelling and tenderness present.     Cervical back: Neck supple.  Skin:    General: Skin is warm and dry.  Neurological:     General: No focal deficit present.     Mental Status: He is alert.  Psychiatric:        Mood and Affect: Mood normal.      UC Treatments / Results  Labs (all labs ordered are listed, but only abnormal results are displayed) Labs Reviewed - No data to display  EKG   Radiology No results found.  Procedures Procedures (including critical care time)  Medications Ordered in UC Medications - No data to display  Initial Impression / Assessment and Plan / UC Course  I have reviewed the triage vital signs and the nursing notes.  Pertinent labs & imaging results that were available during my care of the patient were reviewed by me and considered in my medical decision making (see chart for details).     MDM Pt advised to wear knee sleeve, voltaren,  Follow up with Primary as scheule Final Clinical Impressions(s) / UC Diagnoses   Final diagnoses:  Acute pain of left knee     Discharge Instructions     Return if any problems.    ED Prescriptions    Medication Sig Dispense Auth. Provider   diclofenac (VOLTAREN) 75 MG EC tablet Take 1 tablet (75 mg total)  by mouth 2 (two) times daily. 20 tablet  Elson Areas, [place]     PDMP not reviewed this encounter.  An After Visit Summary was printed and given to the patient.    Elson Areas, [place] 12/08/19 1920

## 2019-12-08 NOTE — ED Triage Notes (Signed)
Pt in with c/o left knee pain that started Monday. States he woke up this morning and knee was more stiff and swollen.  Denies any recent injury to area

## 2019-12-10 ENCOUNTER — Ambulatory Visit: Payer: Managed Care, Other (non HMO) | Admitting: Medical

## 2019-12-10 ENCOUNTER — Other Ambulatory Visit: Payer: Self-pay

## 2019-12-10 ENCOUNTER — Encounter: Payer: Self-pay | Admitting: Medical

## 2019-12-10 ENCOUNTER — Ambulatory Visit
Admission: RE | Admit: 2019-12-10 | Discharge: 2019-12-10 | Disposition: A | Discharge status: Home / Self Care | Payer: Managed Care, Other (non HMO) | Source: Ambulatory Visit | Attending: Medical | Admitting: Medical

## 2019-12-10 VITALS — BP 124/74 | HR 94 | Ht 70.0 in | Wt 209.4 lb

## 2019-12-10 DIAGNOSIS — Z9889 Other specified postprocedural states: Secondary | ICD-10-CM

## 2019-12-10 DIAGNOSIS — M25462 Effusion, left knee: Secondary | ICD-10-CM

## 2019-12-10 DIAGNOSIS — R350 Frequency of micturition: Secondary | ICD-10-CM | POA: Diagnosis not present

## 2019-12-10 DIAGNOSIS — M25562 Pain in left knee: Secondary | ICD-10-CM | POA: Diagnosis not present

## 2019-12-10 LAB — POCT URINALYSIS DIP (PROADVANTAGE DEVICE)
Bilirubin, UA: NEGATIVE
Blood, UA: NEGATIVE
Glucose, UA: NEGATIVE mg/dL
Leukocytes, UA: NEGATIVE
Nitrite, UA: NEGATIVE
Protein Ur, POC: NEGATIVE mg/dL
Specific Gravity, Urine: 1.02
pH, UA: 6 (ref 5.0–8.0)

## 2019-12-10 MED ORDER — TAMSULOSIN HCL 0.4 MG PO CAPS: 0.4000 mg | ORAL_CAPSULE | Freq: Every day | ORAL | 2 refills | Status: DC

## 2019-12-10 NOTE — Patient Instructions (Addendum)
Frequent urination  Several things could cause frequent urination including enlarged prostate, diabetes, urinary tract infection or other  I will send your urine for culture today to look for infection  Begin back on Flomax which was advised months ago for possible prostate enlargement  Return soon for physical and fasting labs to screen for diabetes, prostate, other   Left knee pain and swelling  Begin the diclofenac twice daily for the next several days  you can use cold therapy with ice water bag for 20 minutes on 20 minutes off  Consider knee sleeve or knee brace in the short-term for the next week   please go to [facility] for your left xray.   Their hours are 8am - 4:30 pm Monday - Friday.  Take your insurance card with you.  [facility] [phone]  [address], [address] [place], [place] [address]  [address] [place], [place] [address]

## 2019-12-10 NOTE — Progress Notes (Signed)
Subjective:  Vincent Cook is a 54 y.o. male who presents for Chief Complaint  Patient presents with  . Knee Problem    left   . Urinary Frequency    x2-3 weeks      Here for a few issues.     Went to urgent care 2 days ago for same concerns.   Was evaluated, advised to have xray but he declined.   Started 3 weeks ago with swelling, pain, worse if bending knee.  No injury, no fall, no trauma.  Had 2 prior surgeries of left knee in remote past.  Does work on his feet for 12 hours per day.    He notes increased urination, but no blood in urine, no burning, no discharge, no concern for STD.  Has relationship with same partner x 3 years.    No polydipsia, no blurred vision.   Was prescribed Flomax earlier in the year but he is not taking this.      Past Medical History:  Diagnosis Date  . Eczema   . Erectile dysfunction 2018  . Pneumonia 2008   [name], 3 day hospitalization   No other aggravating or relieving factors.    No other c/o.  The following portions of the patient's history were reviewed and updated as appropriate: allergies, current medications, past family history, past medical history, past social history, past surgical history and problem list.  ROS Otherwise as in subjective above    Objective: BP 124/74   Pulse 94   Ht 5\' 10"  (1.778 m)   Wt 209 lb 6.4 oz (95 kg)   SpO2 93%   BMI 30.05 kg/m   General appearance: alert, no distress, well developed, well nourished Abdomen: +bs, soft, non tender, non distended, no masses, no hepatomegaly, no splenomegaly Back:nontender Pulses: 2+ radial pulses, 2+ pedal pulses, normal cap refill Ext: no edema MSK left knee porthole scars from prior surgery, tender patella, tender over the joint line, mild pain with McMurray, no obvious laxity, pain with flexion, mild swelling of the General knee joint otherwise leg nontender with normal range of motion Legs otherwise neurovascularly intact    Assessment: Encounter  Diagnoses  Name Primary?  . Frequent urination Yes  . Acute pain of left knee   . Pain and swelling of left knee   . S/P knee surgery      Plan: discussed symptoms, concerned, recommendations as below  Patient Instructions  Frequent urination  Several things could cause frequent urination including enlarged prostate, diabetes, urinary tract infection or other  I will send your urine for culture today to look for infection  Begin back on Flomax which was advised months ago for possible prostate enlargement  Return soon for physical and fasting labs to screen for diabetes, prostate, other   Left knee pain and swelling  Begin the diclofenac twice daily for the next several days  you can use cold therapy with ice water bag for 20 minutes on 20 minutes off  Consider knee sleeve or knee brace in the short-term for the next week   please go to [facility] for your left xray.   Their hours are 8am - 4:30 pm Monday - Friday.  Take your insurance card with you.  [facility] [phone]  [phone] [place], [place]  [address] [place], [place]   Vincent Cook was seen today for knee problem and urinary frequency.  Diagnoses and all orders for this visit:  Frequent urination -     POCT Urinalysis DIP (Proadvantage Device) -     Urine Culture  Acute pain of left knee -     DG Knee Complete 4 Views Left; Future  Pain and swelling of left knee -     DG Knee Complete 4 Views Left; Future  S/P knee surgery -     DG Knee Complete 4 Views Left; Future  Other orders -     tamsulosin (FLOMAX) 0.4 MG CAPS capsule; Take 1 capsule (0.4 mg total) by mouth daily.    Follow up: pending xray, lab

## 2019-12-12 LAB — URINE CULTURE: Organism ID, Bacteria: NO GROWTH

## 2019-12-14 ENCOUNTER — Telehealth: Payer: Self-pay | Admitting: Medical

## 2019-12-14 ENCOUNTER — Other Ambulatory Visit: Payer: Self-pay

## 2019-12-14 DIAGNOSIS — M25562 Pain in left knee: Secondary | ICD-10-CM

## 2019-12-14 NOTE — Telephone Encounter (Signed)
Referral has bee sent to [facility].

## 2019-12-14 NOTE — Telephone Encounter (Signed)
See his email about referral to orthopedic which is fine.  I prefer [facility].  He has seen an orthopedic in the past so he may have a preference

## 2020-01-29 ENCOUNTER — Emergency Department (HOSPITAL_BASED_OUTPATIENT_CLINIC_OR_DEPARTMENT_OTHER)
Admission: EM | Admit: 2020-01-29 | Discharge: 2020-01-29 | Disposition: A | Discharge status: Home / Self Care | Payer: Managed Care, Other (non HMO) | Attending: Emergency Medicine | Admitting: Emergency Medicine

## 2020-01-29 ENCOUNTER — Emergency Department (HOSPITAL_BASED_OUTPATIENT_CLINIC_OR_DEPARTMENT_OTHER): Payer: Managed Care, Other (non HMO)

## 2020-01-29 ENCOUNTER — Encounter (HOSPITAL_BASED_OUTPATIENT_CLINIC_OR_DEPARTMENT_OTHER): Payer: Self-pay | Admitting: Emergency Medicine

## 2020-01-29 ENCOUNTER — Other Ambulatory Visit: Payer: Self-pay

## 2020-01-29 DIAGNOSIS — Z87891 Personal history of nicotine dependence: Secondary | ICD-10-CM | POA: Insufficient documentation

## 2020-01-29 DIAGNOSIS — U071 COVID-19: Secondary | ICD-10-CM | POA: Diagnosis not present

## 2020-01-29 DIAGNOSIS — R059 Cough, unspecified: Secondary | ICD-10-CM | POA: Diagnosis present

## 2020-01-29 DIAGNOSIS — Z20822 Contact with and (suspected) exposure to covid-19: Secondary | ICD-10-CM

## 2020-01-29 DIAGNOSIS — R079 Chest pain, unspecified: Secondary | ICD-10-CM

## 2020-01-29 LAB — TROPONIN I (HIGH SENSITIVITY)
Troponin I (High Sensitivity): 5 ng/L (ref <18)
Troponin I (High Sensitivity): 6 ng/L (ref <18)

## 2020-01-29 LAB — BASIC METABOLIC PANEL
Anion gap: 11 (ref 5–15)
BUN: 9 mg/dL (ref 6–20)
CO2: 23 mmol/L (ref 22–32)
Calcium: 8.4 mg/dL — ABNORMAL LOW (ref 8.9–10.3)
Chloride: 100 mmol/L (ref 98–111)
Creatinine, Ser: 1.16 mg/dL (ref 0.61–1.24)
GFR, Estimated: >60 mL/min (ref >60)
Glucose, Bld: 175 mg/dL — ABNORMAL HIGH (ref 70–99)
Potassium: 3.3 mmol/L — ABNORMAL LOW (ref 3.5–5.1)
Sodium: 134 mmol/L — ABNORMAL LOW (ref 135–145)

## 2020-01-29 LAB — CBC WITH DIFFERENTIAL/PLATELET
Abs Immature Granulocytes: 0.01 10*3/uL (ref 0.00–0.07)
Basophils Absolute: 0 10*3/uL (ref 0.0–0.1)
Basophils Relative: 1 %
Eosinophils Absolute: 0 10*3/uL (ref 0.0–0.5)
Eosinophils Relative: 0 %
HCT: 44.6 % (ref 39.0–52.0)
Hemoglobin: 15 g/dL (ref 13.0–17.0)
Immature Granulocytes: 0 %
Lymphocytes Relative: 27 %
Lymphs Abs: 1.2 10*3/uL (ref 0.7–4.0)
MCH: 31.6 pg (ref 26.0–34.0)
MCHC: 33.6 g/dL (ref 30.0–36.0)
MCV: 93.9 fL (ref 80.0–100.0)
Monocytes Absolute: 0.9 10*3/uL (ref 0.1–1.0)
Monocytes Relative: 21 %
Neutro Abs: 2.2 10*3/uL (ref 1.7–7.7)
Neutrophils Relative %: 51 %
Platelets: 181 10*3/uL (ref 150–400)
RBC: 4.75 MIL/uL (ref 4.22–5.81)
RDW: 12.1 % (ref 11.5–15.5)
WBC: 4.4 10*3/uL (ref 4.0–10.5)
nRBC: 0 % (ref 0.0–0.2)

## 2020-01-29 MED ORDER — IBUPROFEN 600 MG PO TABS: 600.0000 mg | ORAL_TABLET | Freq: Four times a day (QID) | ORAL | 0 refills | Status: DC | PRN

## 2020-01-29 MED ORDER — BENZONATATE 100 MG PO CAPS
100.0000 mg | ORAL_CAPSULE | Freq: Once | ORAL | Status: AC
  Administered 2020-01-29: 100 mg via ORAL
  Filled 2020-01-29: qty 1

## 2020-01-29 MED ORDER — POTASSIUM CHLORIDE CRYS ER 20 MEQ PO TBCR
20.0000 meq | EXTENDED_RELEASE_TABLET | Freq: Once | ORAL | Status: AC
  Administered 2020-01-29: 20 meq via ORAL
  Filled 2020-01-29: qty 1

## 2020-01-29 MED ORDER — BENZONATATE 100 MG PO CAPS: 100.0000 mg | ORAL_CAPSULE | Freq: Three times a day (TID) | ORAL | 0 refills | Status: DC

## 2020-01-29 MED ORDER — ACETAMINOPHEN 325 MG PO TABS
650.0000 mg | ORAL_TABLET | Freq: Once | ORAL | Status: AC | PRN
  Administered 2020-01-29: 650 mg via ORAL
  Filled 2020-01-29: qty 2

## 2020-01-29 MED ORDER — ONDANSETRON 4 MG PO TBDP: 4.0000 mg | ORAL_TABLET | Freq: Three times a day (TID) | ORAL | 0 refills | Status: DC | PRN

## 2020-01-29 NOTE — Discharge Instructions (Addendum)
As discussed, all of your labs are reassuring today.  Your cardiac markers were normal.  I suspect your symptoms are related to Covid infection or another viral infection.  Your Covid results are pending.  Your results should become available within 24 hours.  Continue to quarantine until your results are available.  I am sending you home with cough medication, nausea medication, and ibuprofen.  Take as needed.  Please follow-up with PCP symptoms not improved within the next week.  Return to the ER for new or worsening symptoms.

## 2020-01-29 NOTE — ED Triage Notes (Signed)
Cough, body aches, fever since yesterday. Known covid exposure.

## 2020-01-29 NOTE — ED Notes (Signed)
Pt ambulate in room, on room air, with pulse ox pt ranged 97-100%.

## 2020-01-29 NOTE — ED Provider Notes (Signed)
[facility] EMERGENCY DEPARTMENT Provider Note   CSN: [id number] Arrival date & time: 01/29/20  1101     History Chief Complaint  Patient presents with  . Cough    Vincent Cook is a 55 y.o. male with no significant past medical history presents to the ED due to cough and body aches x3 days.  Patient was recently around his sister-in-law who tested positive for Covid on Friday.  Patient admits to associated chest pain and shortness of breath that started yesterday.  Chest pain is constant, substernal, and radiates up to throat. Chest pain associated with SOB.  Denies history of blood clots, recent surgeries, recent long immobilizations, lower extremity edema, and hormonal treatment.  No previous MIs/CVAs.  No cardiac history.  No treatment prior to arrival.  No aggravating or alleviating factors.  Patient denies abdominal pain, nausea, vomiting, diarrhea, urinary symptoms, headaches  History obtained from patient and past medical records. No interpreter used during encounter.   HPI: A 55 year old patient presents for evaluation of chest pain. Initial onset of pain was less than one hour ago. The patient's chest pain is not worse with exertion. The patient's chest pain is middle- or left-sided, is not well-localized, is not described as heaviness/pressure/tightness, is not sharp and does not radiate to the arms/jaw/neck. The patient does not complain of nausea and denies diaphoresis. The patient has no history of stroke, has no history of peripheral artery disease, has not smoked in the past 90 days, denies any history of treated diabetes, has no relevant family history of coronary artery disease (first degree relative at less than age 21), is not hypertensive, has no history of hypercholesterolemia and does not have an elevated BMI (>=30).   Past Medical History:  Diagnosis Date  . Eczema   . Erectile dysfunction 2018  . Pneumonia 2008   [name], 3 day hospitalization     Patient Active Problem List   Diagnosis Date Noted  . S/P knee surgery 12/10/2019  . Pain and swelling of left knee 12/10/2019  . Acute pain of left knee 12/10/2019  . Frequent urination 12/10/2019  . Screening for prostate cancer 07/07/2018  . Sleep disturbance 07/07/2018  . Snoring 07/07/2018  . Erectile dysfunction 07/07/2018  . Witnessed apneic spells 07/07/2018  . Fatigue 07/07/2018  . Vaccine counseling 07/07/2018  . Encounter for hepatitis C screening test for low risk patient 07/07/2018    Past Surgical History:  Procedure Laterality Date  . KNEE SURGERY     left, ligament repair, Dr. Priscille Kluver  . TONSILLECTOMY         Family History  Problem Relation Age of Onset  . Diabetes Mother   . Other Father        natural causes  . Diabetes Brother   . Cancer Maternal Uncle        prostate  . Heart disease Neg Hx   . Hypertension Neg Hx   . Stroke Neg Hx     Social History   Tobacco Use  . Smoking status: Former Smoker    Packs/day: 1.00    Years: 8.00    Pack years: 8.00    Quit date: 02/25/2016    Years since quitting: 3.9  . Smokeless tobacco: Never Used  Vaping Use  . Vaping Use: Never used  Substance Use Topics  . Alcohol use: Yes    Alcohol/week: 6.0 standard drinks    Types: 6 Cans of beer per week    Comment: socially  .  Drug use: Not Currently    Home Medications Prior to Admission medications   Medication Sig Start Date End Date Taking? Authorizing Provider  benzonatate (TESSALON) 100 MG capsule Take 1 capsule (100 mg total) by mouth every 8 (eight) hours. 01/29/20  Yes Ryder Chesmore C, PA-C  ibuprofen (ADVIL) 600 MG tablet Take 1 tablet (600 mg total) by mouth every 6 (six) hours as needed. 01/29/20  Yes Delwyn Scoggin C, PA-C  ondansetron (ZOFRAN ODT) 4 MG disintegrating tablet Take 1 tablet (4 mg total) by mouth every 8 (eight) hours as needed for nausea or vomiting. 01/29/20  Yes Rhodia Acres C, PA-C  benzonatate (TESSALON) 200 MG  capsule Take 1 capsule (200 mg total) by mouth 3 (three) times daily as needed for cough. Patient not taking: Reported on 12/10/2019 10/23/19   Volney American, PA-C  diclofenac (VOLTAREN) 75 MG EC tablet Take 1 tablet (75 mg total) by mouth 2 (two) times daily. 12/08/19   Fransico Meadow, PA-C  ibuprofen (ADVIL) 600 MG tablet Take 1 tablet (600 mg total) by mouth every 6 (six) hours as needed. 09/07/18   Law, Bea Graff, PA-C  promethazine-dextromethorphan (PROMETHAZINE-DM) 6.25-15 MG/5ML syrup Take 5 mLs by mouth 4 (four) times daily as needed for cough. Patient not taking: Reported on 12/10/2019 10/23/19   Volney American, PA-C  sildenafil (VIAGRA) 100 MG tablet Take 1 tablet (100 mg total) by mouth as needed for erectile dysfunction. 09/29/19   Tysinger, Camelia Eng, PA-C  tamsulosin (FLOMAX) 0.4 MG CAPS capsule Take 1 capsule (0.4 mg total) by mouth daily. 12/10/19   Tysinger, Camelia Eng, PA-C  terbinafine (LAMISIL AT) 1 % cream Apply 1 application topically 2 (two) times daily. 03/05/19   Tysinger, Camelia Eng, PA-C  triamcinolone cream (KENALOG) 0.1 % APPLY A THIN LAYER TO AFFECTED AREA(S) TOPICALLY TWICE A DAY 08/17/18   [provider]    Allergies    Patient has no known allergies.  Review of Systems   Review of Systems  Constitutional: Positive for chills and fever.  Respiratory: Positive for cough and shortness of breath.   Cardiovascular: Positive for chest pain. Negative for leg swelling.  Gastrointestinal: Negative for abdominal pain, diarrhea, nausea and vomiting.  All other systems reviewed and are negative.   Physical Exam Updated Vital Signs BP 104/72 (BP Location: Left Arm)   Pulse 70   Temp 100.2 F (37.9 C) (Oral)   Resp 17   Ht 5\' 10"  (1.778 m)   Wt 93.9 kg   SpO2 100%   BMI 29.70 kg/m   Physical Exam Vitals and nursing note reviewed.  Constitutional:      General: He is not in acute distress.    Appearance: He is not ill-appearing.  HENT:      Head: Normocephalic.  Eyes:     Pupils: Pupils are equal, round, and reactive to light.  Cardiovascular:     Rate and Rhythm: Normal rate and regular rhythm.     Pulses: Normal pulses.     Heart sounds: Normal heart sounds. No murmur heard. No friction rub. No gallop.   Pulmonary:     Effort: Pulmonary effort is normal.     Breath sounds: Normal breath sounds.     Comments: Respirations equal and unlabored, patient able to speak in full sentences, lungs clear to auscultation bilaterally Abdominal:     General: Abdomen is flat. Bowel sounds are normal. There is no distension.     Palpations: Abdomen is soft.  Tenderness: There is no abdominal tenderness. There is no guarding or rebound.  Musculoskeletal:     Cervical back: Neck supple.     Comments: No lower extremity edema. Negative homan sign bilaterally.  Skin:    General: Skin is warm and dry.  Neurological:     General: No focal deficit present.     Mental Status: He is alert.  Psychiatric:        Mood and Affect: Mood normal.        Behavior: Behavior normal.     ED Results / Procedures / Treatments   Labs (all labs ordered are listed, but only abnormal results are displayed) Labs Reviewed  BASIC METABOLIC PANEL - Abnormal; Notable for the following components:      Result Value   Sodium 134 (*)    Potassium 3.3 (*)    Glucose, Bld 175 (*)    Calcium 8.4 (*)    All other components within normal limits  SARS CORONAVIRUS 2 (TAT 6-24 HRS)  CBC WITH DIFFERENTIAL/PLATELET  TROPONIN I (HIGH SENSITIVITY)  TROPONIN I (HIGH SENSITIVITY)    EKG EKG Interpretation  Date/Time:  Saturday January 29 2020 18:12:24 EST Ventricular Rate:  68 PR Interval:    QRS Duration: 108 QT Interval:  387 QTC Calculation: 412 R Axis:   11 Text Interpretation: Sinus rhythm Abnormal R-wave progression, early transition Confirmed by Benjiman Core [phone]) on 01/29/2020 10:35:08 PM   Radiology DG Chest Portable 1 View  Result  Date: 01/29/2020 CLINICAL DATA:  Chest pain. EXAM: PORTABLE CHEST 1 VIEW COMPARISON:  February 09, 2018. FINDINGS: The heart size and mediastinal contours are within normal limits. Both lungs are clear. No pneumothorax or pleural effusion is noted. The visualized skeletal structures are unremarkable. IMPRESSION: No active disease. Electronically Signed   By: Lupita Raider M.D.   On: 01/29/2020 17:07    Procedures Procedures (including critical care time)  Medications Ordered in ED Medications  potassium chloride SA (KLOR-CON) CR tablet 20 mEq (has no administration in time range)  acetaminophen (TYLENOL) tablet 650 mg (650 mg Oral Given 01/29/20 1133)  benzonatate (TESSALON) capsule 100 mg (100 mg Oral Given 01/29/20 2017)    ED Course  I have reviewed the triage vital signs and the nursing notes.  Pertinent labs & imaging results that were available during my care of the patient were reviewed by me and considered in my medical decision making (see chart for details).  Clinical Course as of 01/29/20 2237  Sat Jan 29, 2020  1621 Temp(!): 103.2 F (39.6 C) [CA]  1622 Pulse Rate(!): 102 [CA]  1817 Personally rechecked BP at bedside. Patient had large cuff on arm that was falling off. Upon recheck and change to regular cuff, patient's BP 133/77. Suspect hypotension inaccurate.  [CA]    Clinical Course User Index [CA] Mannie Stabile, PA-C   MDM Rules/Calculators/A&P HEAR Score: 1                       55 year old male presents to the ED due to cough, body aches, and fever.  Positive Covid exposure.  He is currently unvaccinated.  Patient also admits to chest pain and shortness of breath that started yesterday.  Denies history of blood clots, recent surgeries, recent long immobilizations, hormonal treatments, and lower extremity edema.  Upon arrival, patient febrile at 103.2 F and mildly tachycardic at 102.  No hypoxia.  Suspect tachycardia related to temperature.  Physical exam reassuring.  Lungs clear to auscultation bilaterally.  No rales, rhonchi, or wheeze.  No lower extremity edema.  Negative Homans' sign bilaterally.  No clinical signs of DVT on exam.  Low suspicion for DVT/PE.  Routine labs ordered.  Troponin, EKG, chest x-ray to rule out cardiac etiology however, chest pain appears atypical in nature.  Covid test ordered to rule out Covid infection which I suspect is causing his symptoms given his recent exposure.  CBC unremarkable no leukocytosis and normal hemoglobin.  Initial troponin normal at 5.  Will obtain delta troponin to rule out ACS.  BMP significant for mild hyponatremia at 134, hypokalemia at 3.3, and hyperglycemia at 175.  No anion gap.  Doubt DKA.  Potassium repleted here in the ED.  Chest x-ray personally reviewed which is negative for signs pneumonia, pneumothorax marijuana mediastinum.  EKG personally reviewed which demonstrates normal sinus rhythm with no signs of acute ischemia.  Delta troponin flat.  Low suspicion for ACS.  Suspect symptoms related to Covid infection vs another viral infection.  COVID test pending Patient able to ambulate here in the ED and maintained O2 saturation above 95% without difficulty.  Will discharge patient with symptomatic treatment.  Quarantine guidelines discussed with patient. Strict ED precautions discussed with patient. Patient states understanding and agrees to plan. Patient discharged home in no acute distress and stable vitals  Camila Norville was evaluated in Emergency Department on 01/29/2020 for the symptoms described in the history of present illness. He was evaluated in the context of the global COVID-19 pandemic, which necessitated consideration that the patient might be at risk for infection with the SARS-CoV-2 virus that causes COVID-19. Institutional protocols and algorithms that pertain to the evaluation of patients at risk for COVID-19 are in a state of rapid change based on information released by regulatory bodies  including the CDC and federal and state organizations. These policies and algorithms were followed during the patient's care in the ED.  Final Clinical Impression(s) / ED Diagnoses Final diagnoses:  Suspected COVID-19 virus infection  Nonspecific chest pain    Rx / DC Orders ED Discharge Orders         Ordered    benzonatate (TESSALON) 100 MG capsule  Every 8 hours        01/29/20 2143    ondansetron (ZOFRAN ODT) 4 MG disintegrating tablet  Every 8 hours PRN        01/29/20 2143    ibuprofen (ADVIL) 600 MG tablet  Every 6 hours PRN        01/29/20 2143           Jesusita Oka 01/29/20 2238    Benjiman Core, MD 01/29/20 2328

## 2020-01-30 LAB — SARS CORONAVIRUS 2 (TAT 6-24 HRS): SARS Coronavirus 2: POSITIVE — AB

## 2020-02-03 ENCOUNTER — Encounter: Payer: Self-pay | Admitting: Medical

## 2020-02-03 ENCOUNTER — Telehealth: Payer: Managed Care, Other (non HMO) | Admitting: Medical

## 2020-02-03 VITALS — Temp 100.4°F | Ht 70.0 in | Wt 207.0 lb

## 2020-02-03 DIAGNOSIS — U071 COVID-19: Secondary | ICD-10-CM | POA: Diagnosis not present

## 2020-02-03 DIAGNOSIS — R52 Pain, unspecified: Secondary | ICD-10-CM

## 2020-02-03 DIAGNOSIS — R5383 Other fatigue: Secondary | ICD-10-CM | POA: Diagnosis not present

## 2020-02-03 MED ORDER — EMERGEN-C IMMUNE PLUS PO PACK: 1.0000 | PACK | Freq: Two times a day (BID) | ORAL | 0 refills | Status: DC

## 2020-02-03 NOTE — Progress Notes (Signed)
Subjective:     Patient ID: Vincent Cook, male   DOB: 21-Oct-1965, 55 y.o.   MRN: [id number]  This visit type was conducted due to national recommendations for restrictions regarding the COVID-19 Pandemic (e.g. social distancing) in an effort to limit this patient's exposure and mitigate transmission in our community.  Due to their co-morbid illnesses, this patient is at least at moderate risk for complications without adequate follow up.  This format is felt to be most appropriate for this patient at this time.    Documentation for virtual audio and video telecommunications through [facility] encounter:  The patient was located at home. The provider was located in the office. The patient did consent to this visit and is aware of possible charges through their insurance for this visit.  The other persons participating in this telemedicine service were none. Time spent on call was 20 minutes and in review of previous records 20 minutes total.  This virtual service is not related to other E/M service within previous 7 days.   HPI Chief Complaint  Patient presents with  . Covid Positive    Result last 01/29/20. Still having fever, sweats, muscle pain and headache. Symptoms started 01/28/20. Will need work note.     Virtual consult for illness.   Symptoms began 01/28/20.  Symptoms  Included fever, sweats, body aches, headache.  No cough.   Has had some loss in taste.   No NVD.  Feels fatigue, but no SOB or wheezing.   Sore throat resolved.  Can't seem to shake aches and fever.   Has some mucous.  Temp currently 100.4.    He did visit few days ago with urgent care, was prescribed tessalon perles and zofran.  Using some Ibuprofen 600mg  prescription, 3 times daily.  However the fever will not break and he still feels extreme fatigue.  He is hydrating well.  No other aggravating or relieving factors. No other complaint.  Past Medical History:  Diagnosis Date  . Eczema   . Erectile dysfunction  2018  . Pneumonia 2008   [facility] [place], 3 day hospitalization    Review of Systems As in subjective    Objective:   Physical Exam Due to coronavirus pandemic stay at home measures, patient visit was virtual and they were not examined in person.    Temp (!) 100.4 F (38 C)   Ht 5\' 10"  (1.778 m)   Wt 207 lb (93.9 kg)   BMI 29.70 kg/m   Gen: wd, wn, nad, somewhat ill appearing hoarse voice, but no dyspnea or wheezing obvious Answers questions in complete sentences.      Assessment:     Encounter Diagnoses  Name Primary?  . COVID-19 Yes  . Body aches   . Fatigue, unspecified type        Plan:     We discussed his current symptoms his positive COVID status.    At this point advise he continue to really hydrate well, rest, listen to the body in terms of energy and rest, continue Tessalon Perles for cough, Zofran for nausea.  He will alternate ibuprofen and Tylenol.  He feels like he responded better to Tylenol the first day but he is only been using the prescription ibuprofen since then.  He will go back to alternating Tylenol and ibuprofen every 4 hours.  We discussed that fever is a natural process but to call or get reevaluated if fever starts to go up such as 103 or higher.  His recent  temperatures are less than 101  We discussed that COVID symptoms particularly fatigue and energy can take weeks to fully get back to normal  We discussed symptoms that would prompt a recheck including uncontrollable vomiting, shortness of breath, dehydration.  Currently he notes no shortness of breath no current nausea or vomiting, and feels like he is hydrating a good amount  I wrote him out of work today since he was supposed to go back tonight.  He will plan to return back to work on Monday in 4 days  He voiced understanding and agreement of plan and will call back if not improving or worse  Vincent Cook was seen today for covid positive.  Diagnoses and all orders for this  visit:  COVID-19  Body aches  Fatigue, unspecified type  Other orders -     Multiple Vitamins-Minerals (EMERGEN-C IMMUNE PLUS) PACK; Take 1 tablet by mouth 2 (two) times daily.  Follow-up as needed

## 2020-03-25 ENCOUNTER — Other Ambulatory Visit: Payer: Self-pay

## 2020-03-25 ENCOUNTER — Ambulatory Visit (HOSPITAL_COMMUNITY)
Admission: EM | Admit: 2020-03-25 | Discharge: 2020-03-25 | Disposition: A | Discharge status: Home / Self Care | Payer: Managed Care, Other (non HMO) | Attending: Family Medicine | Admitting: Family Medicine

## 2020-03-25 ENCOUNTER — Encounter (HOSPITAL_COMMUNITY): Payer: Self-pay

## 2020-03-25 DIAGNOSIS — G8929 Other chronic pain: Secondary | ICD-10-CM | POA: Diagnosis not present

## 2020-03-25 DIAGNOSIS — M25562 Pain in left knee: Secondary | ICD-10-CM

## 2020-03-25 MED ORDER — PREDNISONE 20 MG PO TABS: 40.0000 mg | ORAL_TABLET | Freq: Every day | ORAL | 0 refills | Status: DC

## 2020-03-25 NOTE — ED Triage Notes (Signed)
Pt present left knee swelling. Pt states symptoms started last night. Pt has a history of receiving cortisone shots in his knee

## 2020-03-25 NOTE — ED Provider Notes (Signed)
[facility]    CSN: [id number] Arrival date & time: 03/25/20  1244      History   Chief Complaint Chief Complaint  Patient presents with  . Joint Swelling    Left knee    HPI Vincent Cook is a 55 y.o. male.   Here today with 1 day history of acute on chronic left knee pain and swelling that started last night.  He states the pain became worse while he was walking around at work, no new injury prior to onset.  Has known osteoarthritis in this knee with last x-ray November 2021 per chart review.  Follows with orthopedics for this, and states he was scheduled to have an MRI beginning of the year but got Covid and has not rescheduled since then.  Typically gets cortisone injections into the knee every few months, last injection was December 2021.  Denies fever, redness or other skin changes, numbness, tingling.  Has been taking as needed ibuprofen with mild temporary relief.     Past Medical History:  Diagnosis Date  . Eczema   . Erectile dysfunction 2018  . Pneumonia 2008   [name], 3 day hospitalization    Patient Active Problem List   Diagnosis Date Noted  . S/P knee surgery 12/10/2019  . Pain and swelling of left knee 12/10/2019  . Acute pain of left knee 12/10/2019  . Frequent urination 12/10/2019  . Screening for prostate cancer 07/07/2018  . Sleep disturbance 07/07/2018  . Snoring 07/07/2018  . Erectile dysfunction 07/07/2018  . Witnessed apneic spells 07/07/2018  . Fatigue 07/07/2018  . Vaccine counseling 07/07/2018  . Encounter for hepatitis C screening test for low risk patient 07/07/2018    Past Surgical History:  Procedure Laterality Date  . KNEE SURGERY     left, ligament repair, Dr. Priscille Kluver  . TONSILLECTOMY         Home Medications    Prior to Admission medications   Medication Sig Start Date End Date Taking? Authorizing Provider  predniSONE (DELTASONE) 20 MG tablet Take 2 tablets (40 mg total) by mouth daily with breakfast.  03/25/20  Yes Particia Nearing, PA-C  benzonatate (TESSALON) 100 MG capsule Take 1 capsule (100 mg total) by mouth every 8 (eight) hours. 01/29/20   Mannie Stabile, PA-C  benzonatate (TESSALON) 200 MG capsule Take 1 capsule (200 mg total) by mouth 3 (three) times daily as needed for cough. Patient not taking: No sig reported 10/23/19   Particia Nearing, PA-C  diclofenac (VOLTAREN) 75 MG EC tablet Take 1 tablet (75 mg total) by mouth 2 (two) times daily. 12/08/19   Elson Areas, PA-C  ibuprofen (ADVIL) 600 MG tablet Take 1 tablet (600 mg total) by mouth every 6 (six) hours as needed. 09/07/18   Law, Waylan Boga, PA-C  ibuprofen (ADVIL) 600 MG tablet Take 1 tablet (600 mg total) by mouth every 6 (six) hours as needed. 01/29/20   Mannie Stabile, PA-C  Multiple Vitamins-Minerals (EMERGEN-C IMMUNE PLUS) PACK Take 1 tablet by mouth 2 (two) times daily. 02/03/20   Tysinger, Kermit Balo, PA-C  ondansetron (ZOFRAN ODT) 4 MG disintegrating tablet Take 1 tablet (4 mg total) by mouth every 8 (eight) hours as needed for nausea or vomiting. 01/29/20   Mannie Stabile, PA-C  promethazine-dextromethorphan (PROMETHAZINE-DM) 6.25-15 MG/5ML syrup Take 5 mLs by mouth 4 (four) times daily as needed for cough. Patient not taking: No sig reported 10/23/19   Particia Nearing, PA-C  sildenafil (  VIAGRA) 100 MG tablet Take 1 tablet (100 mg total) by mouth as needed for erectile dysfunction. 09/29/19   Tysinger, Kermit Balo, PA-C  tamsulosin (FLOMAX) 0.4 MG CAPS capsule Take 1 capsule (0.4 mg total) by mouth daily. 12/10/19   Tysinger, Kermit Balo, PA-C  terbinafine (LAMISIL AT) 1 % cream Apply 1 application topically 2 (two) times daily. 03/05/19   Tysinger, Kermit Balo, PA-C  triamcinolone cream (KENALOG) 0.1 % APPLY A THIN LAYER TO AFFECTED AREA(S) TOPICALLY TWICE A DAY 08/17/18   [provider]    Family History Family History  Problem Relation Age of Onset  . Diabetes Mother   . Other Father         natural causes  . Diabetes Brother   . Cancer Maternal Uncle        prostate  . Heart disease Neg Hx   . Hypertension Neg Hx   . Stroke Neg Hx     Social History Social History   Tobacco Use  . Smoking status: Former Smoker    Packs/day: 1.00    Years: 8.00    Pack years: 8.00    Quit date: 02/25/2016    Years since quitting: 4.0  . Smokeless tobacco: Never Used  Vaping Use  . Vaping Use: Never used  Substance Use Topics  . Alcohol use: Yes    Alcohol/week: 6.0 standard drinks    Types: 6 Cans of beer per week    Comment: socially  . Drug use: Not Currently     Allergies   Patient has no known allergies.   Review of Systems Review of Systems Per HPI  Physical Exam Triage Vital Signs ED Triage Vitals  Enc Vitals Group     BP 03/25/20 1309 132/77     Pulse Rate 03/25/20 1309 80     Resp 03/25/20 1309 16     Temp 03/25/20 1309 (!) 97.5 F (36.4 C)     Temp Source 03/25/20 1309 Oral     SpO2 03/25/20 1309 100 %     Weight --      Height --      Head Circumference --      Peak Flow --      Pain Score 03/25/20 1310 6     Pain Loc --      Pain Edu? --      Excl. in GC? --    No data found.  Updated Vital Signs BP 132/77 (BP Location: Right Arm)   Pulse 80   Temp (!) 97.5 F (36.4 C) (Oral)   Resp 16   SpO2 100%   Visual Acuity Right Eye Distance:   Left Eye Distance:   Bilateral Distance:    Right Eye Near:   Left Eye Near:    Bilateral Near:     Physical Exam Vitals and nursing note reviewed.  Constitutional:      Appearance: Normal appearance.  HENT:     Head: Atraumatic.  Eyes:     Extraocular Movements: Extraocular movements intact.     Conjunctiva/sclera: Conjunctivae normal.  Cardiovascular:     Rate and Rhythm: Normal rate and regular rhythm.     Pulses: Normal pulses.  Pulmonary:     Effort: Pulmonary effort is normal.     Breath sounds: Normal breath sounds.  Musculoskeletal:        General: Tenderness (left anterior and  b/l joint line ttp at knee) present. No swelling, deformity or signs of injury. Normal range  of motion.     Cervical back: Normal range of motion and neck supple.  Skin:    General: Skin is warm and dry.     Findings: No bruising, erythema or lesion.  Neurological:     General: No focal deficit present.     Mental Status: He is oriented to person, place, and time.     Sensory: No sensory deficit.     Motor: No weakness.     Gait: Gait normal.  Psychiatric:        Mood and Affect: Mood normal.        Thought Content: Thought content normal.        Judgment: Judgment normal.      UC Treatments / Results  Labs (all labs ordered are listed, but only abnormal results are displayed) Labs Reviewed - No data to display  EKG   Radiology No results found.  Procedures Procedures (including critical care time)  Medications Ordered in UC Medications - No data to display  Initial Impression / Assessment and Plan / UC Course  I have reviewed the triage vital signs and the nursing notes.  Pertinent labs & imaging results that were available during my care of the patient were reviewed by me and considered in my medical decision making (see chart for details).    Acute on chronic issue-known history of advanced osteoarthritis in this joint.  We will treat current flare with a prednisone burst and diclofenac gel.  Follow-up with orthopedics next week for further evaluation and treatment of this issue.  Work note given.  Final Clinical Impressions(s) / UC Diagnoses   Final diagnoses:  Chronic pain of left knee     Discharge Instructions     Follow-up with your orthopedist next week to discuss ongoing management of your chronic left knee pain    ED Prescriptions    Medication Sig Dispense Auth. Provider   predniSONE (DELTASONE) 20 MG tablet Take 2 tablets (40 mg total) by mouth daily with breakfast. 10 tablet Particia Nearing, [place]     PDMP not reviewed this  encounter.   Particia Nearing, [place] 03/25/20 1410

## 2020-03-25 NOTE — Discharge Instructions (Signed)
Follow-up with your orthopedist next week to discuss ongoing management of your chronic left knee pain

## 2020-03-30 ENCOUNTER — Telehealth: Payer: Self-pay | Admitting: Medical

## 2020-03-30 ENCOUNTER — Other Ambulatory Visit: Payer: Self-pay | Admitting: Medical

## 2020-03-30 MED ORDER — TRIAMCINOLONE ACETONIDE 0.1 % EX CREA: TOPICAL_CREAM | CUTANEOUS | 1 refills | Status: DC

## 2020-03-30 NOTE — Telephone Encounter (Signed)
[facility] triamconoclone cream [facility] on [id number] [place]

## 2020-04-12 ENCOUNTER — Ambulatory Visit (HOSPITAL_COMMUNITY)
Admission: EM | Admit: 2020-04-12 | Discharge: 2020-04-12 | Disposition: A | Discharge status: Home / Self Care | Payer: Managed Care, Other (non HMO)

## 2020-04-12 ENCOUNTER — Emergency Department (HOSPITAL_COMMUNITY): Payer: Managed Care, Other (non HMO)

## 2020-04-12 ENCOUNTER — Emergency Department (HOSPITAL_COMMUNITY)
Admission: EM | Admit: 2020-04-12 | Discharge: 2020-04-13 | Disposition: A | Discharge status: Home / Self Care | Payer: Managed Care, Other (non HMO) | Attending: Emergency Medicine | Admitting: Emergency Medicine

## 2020-04-12 ENCOUNTER — Other Ambulatory Visit: Payer: Self-pay

## 2020-04-12 DIAGNOSIS — Z87891 Personal history of nicotine dependence: Secondary | ICD-10-CM | POA: Diagnosis not present

## 2020-04-12 DIAGNOSIS — R9431 Abnormal electrocardiogram [ECG] [EKG]: Secondary | ICD-10-CM | POA: Insufficient documentation

## 2020-04-12 DIAGNOSIS — R112 Nausea with vomiting, unspecified: Secondary | ICD-10-CM | POA: Insufficient documentation

## 2020-04-12 DIAGNOSIS — R1013 Epigastric pain: Secondary | ICD-10-CM | POA: Insufficient documentation

## 2020-04-12 LAB — CBC
HCT: 48.7 % (ref 39.0–52.0)
Hemoglobin: 16.4 g/dL (ref 13.0–17.0)
MCH: 31.8 pg (ref 26.0–34.0)
MCHC: 33.7 g/dL (ref 30.0–36.0)
MCV: 94.6 fL (ref 80.0–100.0)
Platelets: 291 10*3/uL (ref 150–400)
RBC: 5.15 MIL/uL (ref 4.22–5.81)
RDW: 12.4 % (ref 11.5–15.5)
WBC: 16.9 10*3/uL — ABNORMAL HIGH (ref 4.0–10.5)
nRBC: 0 % (ref 0.0–0.2)

## 2020-04-12 LAB — COMPREHENSIVE METABOLIC PANEL
ALT: 27 U/L (ref 0–44)
AST: 29 U/L (ref 15–41)
Albumin: 4.1 g/dL (ref 3.5–5.0)
Alkaline Phosphatase: 62 U/L (ref 38–126)
Anion gap: 14 (ref 5–15)
BUN: 18 mg/dL (ref 6–20)
CO2: 18 mmol/L — ABNORMAL LOW (ref 22–32)
Calcium: 9.2 mg/dL (ref 8.9–10.3)
Chloride: 101 mmol/L (ref 98–111)
Creatinine, Ser: 1.35 mg/dL — ABNORMAL HIGH (ref 0.61–1.24)
GFR, Estimated: >60 mL/min (ref >60)
Glucose, Bld: 202 mg/dL — ABNORMAL HIGH (ref 70–99)
Potassium: 4.5 mmol/L (ref 3.5–5.1)
Sodium: 133 mmol/L — ABNORMAL LOW (ref 135–145)
Total Bilirubin: 1.7 mg/dL — ABNORMAL HIGH (ref 0.3–1.2)
Total Protein: 8.1 g/dL (ref 6.5–8.1)

## 2020-04-12 LAB — URINALYSIS, ROUTINE W REFLEX MICROSCOPIC
Bilirubin Urine: NEGATIVE
Glucose, UA: 50 mg/dL — AB
Hgb urine dipstick: NEGATIVE
Ketones, ur: 80 mg/dL — AB
Leukocytes,Ua: NEGATIVE
Nitrite: NEGATIVE
Protein, ur: 30 mg/dL — AB
Specific Gravity, Urine: 1.026 (ref 1.005–1.030)
pH: 5 (ref 5.0–8.0)

## 2020-04-12 LAB — TROPONIN I (HIGH SENSITIVITY): Troponin I (High Sensitivity): 7 ng/L (ref <18)

## 2020-04-12 LAB — LIPASE, BLOOD: Lipase: 29 U/L (ref 11–51)

## 2020-04-12 MED ORDER — SODIUM CHLORIDE 0.9 % IV BOLUS
1000.0000 mL | Freq: Once | INTRAVENOUS | Status: AC
  Administered 2020-04-12: 1000 mL via INTRAVENOUS

## 2020-04-12 MED ORDER — ALUM & MAG HYDROXIDE-SIMETH 200-200-20 MG/5ML PO SUSP
30.0000 mL | Freq: Once | ORAL | Status: AC
  Administered 2020-04-12: 30 mL via ORAL
  Filled 2020-04-12: qty 30

## 2020-04-12 MED ORDER — FAMOTIDINE IN NACL 20-0.9 MG/50ML-% IV SOLN
20.0000 mg | Freq: Once | INTRAVENOUS | Status: AC
  Administered 2020-04-12: 20 mg via INTRAVENOUS
  Filled 2020-04-12: qty 50

## 2020-04-12 MED ORDER — FAMOTIDINE 20 MG PO TABS: 20.0000 mg | ORAL_TABLET | Freq: Two times a day (BID) | ORAL | 0 refills | Status: DC

## 2020-04-12 MED ORDER — ONDANSETRON 4 MG PO TBDP: ORAL_TABLET | ORAL | 0 refills | Status: DC

## 2020-04-12 MED ORDER — LIDOCAINE VISCOUS HCL 2 % MT SOLN
15.0000 mL | Freq: Once | OROMUCOSAL | Status: AC
  Administered 2020-04-12: 15 mL via ORAL
  Filled 2020-04-12: qty 15

## 2020-04-12 MED ORDER — ONDANSETRON HCL 4 MG/2ML IJ SOLN
4.0000 mg | Freq: Once | INTRAMUSCULAR | Status: AC
  Administered 2020-04-12: 4 mg via INTRAVENOUS
  Filled 2020-04-12: qty 2

## 2020-04-12 NOTE — Discharge Instructions (Addendum)
Take Zofran for nausea as needed. Recommended regular use of Pepcid for any epigastric discomfort.   Return to the ED with any new or worsening symptoms.

## 2020-04-12 NOTE — ED Provider Notes (Signed)
Tequila and cocaine all night last night  Epigastric pain and emesis No vomiting since 2:30 No CP Feeling better after fluids, meds EKG abnormal Initial trop negative.   Per cards after EKG review, if delta trop and repeat EKG negative, can be discharged from cardiology standpoint. Will need PO challenge prior to discharge.   On re-examination, the patient reports he is hungry and thirsty. No further vomiting. PO challenge with meal and soda provided.   Delta troponin negative. No interim EKG changes.   After PO challenge, the patient reports he continues to improve and feels much better. VSS. Ok to discharge per previous treatment team.    Elpidio Anis, PA-C 04/13/20 0319    Shon Baton, MD 04/13/20 [id number]

## 2020-04-12 NOTE — ED Triage Notes (Signed)
Pt arrives drinking two liter bottle of ginger ale, c/o abdominal pain, n/v, and hematemesis since noon today. Does report drinking tequila for the first time in a long time last night along with taking a viagra, which sometimes causes his acid reflux to flare.

## 2020-04-12 NOTE — ED Provider Notes (Signed)
[facility] [place] EMERGENCY DEPARTMENT Provider Note   CSN: [id number] Arrival date & time: 04/12/20  1735     History Chief Complaint  Patient presents with  . Abdominal Pain  . Hematemesis    Vincent Cook is a 55 y.o. male.  Vincent Cook is a 55 y.o. male with a history of eczema, erectile dysfunction, who presents to the emergency department for evaluation of epigastric pain, nausea and vomiting.  Patient reports last night he drank 1/5 of tequila, did cocaine and used Viagra, last use these around 5 AM and then went to bed.  Woke up around 1140 feeling poorly with nausea and epigastric pain and then began vomiting.  He reports he had 4 episodes of vomiting.  He reports at first his vomit looked kind of dark and then it seemed to lighten up, he did not see any bright red bleeding.  Reports he last vomited around 2 PM, still reports feeling nauseated with continued epigastric pain was afraid to eat or drink anything, because he felt like he would likely vomit again.  No diarrhea, constipation or blood in the stool.  No fevers or chills.  No chest pain or shortness of breath.  Reports he does not typically use these substances on a regular basis.  He has not tried any medicines at home to treat symptoms.  No other aggravating or alleviating factors.        Past Medical History:  Diagnosis Date  . Eczema   . Erectile dysfunction 2018  . Pneumonia 2008   [name], 3 day hospitalization    Patient Active Problem List   Diagnosis Date Noted  . S/P knee surgery 12/10/2019  . Pain and swelling of left knee 12/10/2019  . Acute pain of left knee 12/10/2019  . Frequent urination 12/10/2019  . Screening for prostate cancer 07/07/2018  . Sleep disturbance 07/07/2018  . Snoring 07/07/2018  . Erectile dysfunction 07/07/2018  . Witnessed apneic spells 07/07/2018  . Fatigue 07/07/2018  . Vaccine counseling 07/07/2018  . Encounter for hepatitis C screening test for  low risk patient 07/07/2018    Past Surgical History:  Procedure Laterality Date  . KNEE SURGERY     left, ligament repair, Dr. Priscille Kluver  . TONSILLECTOMY         Family History  Problem Relation Age of Onset  . Diabetes Mother   . Other Father        natural causes  . Diabetes Brother   . Cancer Maternal Uncle        prostate  . Heart disease Neg Hx   . Hypertension Neg Hx   . Stroke Neg Hx     Social History   Tobacco Use  . Smoking status: Former Smoker    Packs/day: 1.00    Years: 8.00    Pack years: 8.00    Quit date: 02/25/2016    Years since quitting: 4.1  . Smokeless tobacco: Never Used  Vaping Use  . Vaping Use: Never used  Substance Use Topics  . Alcohol use: Yes    Alcohol/week: 6.0 standard drinks    Types: 6 Cans of beer per week    Comment: socially  . Drug use: Not Currently    Home Medications Prior to Admission medications   Medication Sig Start Date End Date Taking? Authorizing Provider  benzonatate (TESSALON) 100 MG capsule Take 1 capsule (100 mg total) by mouth every 8 (eight) hours. 01/29/20   Mannie Stabile, PA-C  benzonatate (TESSALON) 200 MG capsule Take 1 capsule (200 mg total) by mouth 3 (three) times daily as needed for cough. Patient not taking: No sig reported 10/23/19   Particia Nearing, PA-C  diclofenac (VOLTAREN) 75 MG EC tablet Take 1 tablet (75 mg total) by mouth 2 (two) times daily. 12/08/19   Elson Areas, PA-C  ibuprofen (ADVIL) 600 MG tablet Take 1 tablet (600 mg total) by mouth every 6 (six) hours as needed. 09/07/18   Law, Waylan Boga, PA-C  ibuprofen (ADVIL) 600 MG tablet Take 1 tablet (600 mg total) by mouth every 6 (six) hours as needed. 01/29/20   Mannie Stabile, PA-C  Multiple Vitamins-Minerals (EMERGEN-C IMMUNE PLUS) PACK Take 1 tablet by mouth 2 (two) times daily. 02/03/20   Tysinger, Kermit Balo, PA-C  ondansetron (ZOFRAN ODT) 4 MG disintegrating tablet Take 1 tablet (4 mg total) by mouth every 8 (eight) hours  as needed for nausea or vomiting. 01/29/20   Mannie Stabile, PA-C  predniSONE (DELTASONE) 20 MG tablet Take 2 tablets (40 mg total) by mouth daily with breakfast. 03/25/20   Particia Nearing, PA-C  promethazine-dextromethorphan (PROMETHAZINE-DM) 6.25-15 MG/5ML syrup Take 5 mLs by mouth 4 (four) times daily as needed for cough. Patient not taking: No sig reported 10/23/19   Particia Nearing, PA-C  sildenafil (VIAGRA) 100 MG tablet Take 1 tablet (100 mg total) by mouth as needed for erectile dysfunction. 09/29/19   Tysinger, Kermit Balo, PA-C  tamsulosin (FLOMAX) 0.4 MG CAPS capsule Take 1 capsule (0.4 mg total) by mouth daily. 12/10/19   Tysinger, Kermit Balo, PA-C  terbinafine (LAMISIL AT) 1 % cream Apply 1 application topically 2 (two) times daily. 03/05/19   Tysinger, Kermit Balo, PA-C  triamcinolone (KENALOG) 0.1 % APPLY A THIN LAYER TO AFFECTED AREA(S) TOPICALLY TWICE A DAY 03/30/20   Tysinger, Kermit Balo, PA-C    Allergies    Patient has no known allergies.  Review of Systems   Review of Systems  Constitutional: Negative for chills and fever.  HENT: Negative.   Respiratory: Negative for cough and shortness of breath.   Cardiovascular: Negative for chest pain.  Gastrointestinal: Positive for abdominal pain, nausea and vomiting. Negative for blood in stool, constipation and diarrhea.  Genitourinary: Negative for dysuria and frequency.  Musculoskeletal: Negative for arthralgias and myalgias.  Skin: Negative for color change and rash.  Neurological: Negative for dizziness, syncope and light-headedness.  All other systems reviewed and are negative.   Physical Exam Updated Vital Signs BP (!) 142/106 (BP Location: Right Arm)   Pulse (!) 110   Temp 98.3 F (36.8 C)   Resp 16   Ht 5\' 10"  (1.778 m)   Wt 95.3 kg   SpO2 95%   BMI 30.13 kg/m   Physical Exam Vitals and nursing note reviewed.  Constitutional:      General: He is not in acute distress.    Appearance: He is well-developed. He  is not ill-appearing or diaphoretic.     Comments: Well-appearing and in no distress   HENT:     Head: Normocephalic and atraumatic.  Eyes:     General:        Right eye: No discharge.        Left eye: No discharge.  Cardiovascular:     Rate and Rhythm: Normal rate and regular rhythm.     Heart sounds: Normal heart sounds. No murmur heard. No friction rub. No gallop.   Pulmonary:  Effort: Pulmonary effort is normal. No respiratory distress.     Breath sounds: Normal breath sounds. No wheezing or rales.     Comments: Respirations equal and unlabored, patient able to speak in full sentences, lungs clear to auscultation bilaterally  Abdominal:     General: Bowel sounds are normal. There is no distension.     Palpations: Abdomen is soft. There is no mass.     Tenderness: There is abdominal tenderness in the epigastric area. There is no guarding.     Comments: Abdomen is soft, nondistended, bowel sounds present there is some mild epigastric tenderness, all other quadrants nontender to palpation, no guarding or peritoneal signs.  Musculoskeletal:        General: No deformity.     Cervical back: Neck supple.  Skin:    General: Skin is warm and dry.     Capillary Refill: Capillary refill takes less than 2 seconds.  Neurological:     Mental Status: He is alert.     Coordination: Coordination normal.     Comments: Speech is clear, able to follow commands Moves extremities without ataxia, coordination intact  Psychiatric:        Mood and Affect: Mood normal.        Behavior: Behavior normal.     ED Results / Procedures / Treatments   Labs (all labs ordered are listed, but only abnormal results are displayed) Labs Reviewed  COMPREHENSIVE METABOLIC PANEL - Abnormal; Notable for the following components:      Result Value   Sodium 133 (*)    CO2 18 (*)    Glucose, Bld 202 (*)    Creatinine, Ser 1.35 (*)    Total Bilirubin 1.7 (*)    All other components within normal limits   CBC - Abnormal; Notable for the following components:   WBC 16.9 (*)    All other components within normal limits  URINALYSIS, ROUTINE W REFLEX MICROSCOPIC - Abnormal; Notable for the following components:   APPearance HAZY (*)    Glucose, UA 50 (*)    Ketones, ur 80 (*)    Protein, ur 30 (*)    Bacteria, UA RARE (*)    All other components within normal limits  LIPASE, BLOOD  RAPID URINE DRUG SCREEN, HOSP PERFORMED  TROPONIN I (HIGH SENSITIVITY)    EKG EKG Interpretation  Date/Time:  Wednesday April 12 2020 21:45:35 EDT Ventricular Rate:  103 PR Interval:    QRS Duration: 93 QT Interval:  332 QTC Calculation: 435 R Axis:   -42 Text Interpretation: Sinus tachycardia Left axis deviation Abnormal R-wave progression, early transition ST elevation, consider anterolateral injury Confirmed by Alona Bene [phone]) on 04/12/2020 10:06:05 PM   Radiology DG Abd Acute W/Chest  Result Date: 04/12/2020 CLINICAL DATA:  Abdominal pain, nausea and vomiting, hematemesis EXAM: DG ABDOMEN ACUTE WITH 1 VIEW CHEST COMPARISON:  04/04/2003 FINDINGS: Supine and upright frontal views of the abdomen as well as an upright frontal view of the chest are obtained. Cardiac silhouette is unremarkable. No airspace disease, effusion, or pneumothorax. No acute bony abnormalities. The bowel gas pattern is unremarkable without obstruction or ileus. There are no masses or abnormal calcifications. Mild right convex scoliosis of the lumbar spine with multilevel spondylosis. No acute bony abnormality. IMPRESSION: 1. No acute intrathoracic process. 2. Unremarkable bowel gas pattern. Electronically Signed   By: Sharlet Salina M.D.   On: 04/12/2020 21:50    Procedures Procedures   Medications Ordered in ED Medications  sodium  chloride 0.9 % bolus 1,000 mL (1,000 mLs Intravenous New Bag/Given 04/12/20 2221)  ondansetron (ZOFRAN) injection 4 mg (4 mg Intravenous Given 04/12/20 2223)  famotidine (PEPCID) IVPB 20 mg premix  (0 mg Intravenous Stopped 04/12/20 2319)  alum & mag hydroxide-simeth (MAALOX/MYLANTA) 200-200-20 MG/5ML suspension 30 mL (30 mLs Oral Given 04/12/20 2221)    And  lidocaine (XYLOCAINE) 2 % viscous mouth solution 15 mL (15 mLs Oral Given 04/12/20 2221)  sodium chloride 0.9 % bolus 1,000 mL (1,000 mLs Intravenous New Bag/Given 04/12/20 2319)    ED Course  I have reviewed the triage vital signs and the nursing notes.  Pertinent labs & imaging results that were available during my care of the patient were reviewed by me and considered in my medical decision making (see chart for details).  MDM Rules/Calculators/A&P                         55 year old male presents with epigastric pain, nausea and vomiting after drinking a large amount of tequila and using cocaine last night.  Patient reports when he woke up around 11:30 this morning he had 4 episodes of emesis, did not see any bright red blood but vomit was initially dark and then lightened, concerned that he may have had blood in his vomit but has not vomited since 2:30 this afternoon.  He has not tried to eat or drink anything.  Patient is mildly tachycardic, afebrile and overall well-appearing.  Will check abdominal labs, EKG and acute abdominal series.  Will give IV fluids, Zofran, Pepcid and GI cocktail.  I have independently ordered, reviewed and interpreted all labs and imaging: CBC: Leukocytosis of 16.9, normal hemoglobin, I suspect this is in the setting of dehydration and numerous episodes of emesis, patient with reassuring abdominal exam, afebrile. CMP: Sodium of 133, glucose of 202, CO2 of 18, suspect dehydration, creatinine of 1.35, increased from baseline, bili of 1.7, but LFTs otherwise unremarkable, no focal right upper quadrant tenderness. Lipase: WNL UA with ketones and some protein present, likely in the setting of dehydration and vomiting.  EKG was sinus tachycardia, in V2 and V3 there is some upsloping ST elevations which appear  similar to prior EKGs but a bit more pronounced, question some slight ST depressions in inferior leads.  Overall seems similar to prior EKGs, concerned though in the setting of patient's recent cocaine use.  Will get troponin and discussed with cardiology.  Patient has not had any chest pain since using cocaine.  Case discussed with Dr. Clarisa Kindred, Cardiology fellow who reviewed patient's EKGs, is reassured that patient has not had associated chest pain.  If patient has normal initial troponin and delta troponin as well as reassuring repeat EKG feels patient will likely be stable for discharge from a cardiac standpoint with outpatient cardiology follow-up.  Patient's initial troponin is not elevated at 7.  On reevaluation he is feeling much better, is well-appearing, tachycardia has resolved.  Patient reports abdominal pain has resolved and now he just feels hungry.  At shift change care signed out to PA Elpidio Anis who will follow up on patient's repeat troponin and EKG.  Patient will also be p.o. challenge.  Assuming repeat labs and EKG are reassuring patient can be discharged home, I have already placed cardiology referral and sent in Zofran and Pepcid for symptomatic management at home.  Patient provided with return precautions and is in agreement with this plan.  Final Clinical Impression(s) / ED Diagnoses Final  diagnoses:  Epigastric pain  Non-intractable vomiting with nausea, unspecified vomiting type  Abnormal EKG    Rx / DC Orders ED Discharge Orders         Ordered    Ambulatory referral to Cardiology       Comments: Abnormal EKG   04/12/20 2340    ondansetron (ZOFRAN ODT) 4 MG disintegrating tablet        04/12/20 2345    famotidine (PEPCID) 20 MG tablet  2 times daily        04/12/20 2345           Dartha Lodge, [place] 04/13/20 0007    Maia Plan, MD 04/13/20 [phone]

## 2020-04-12 NOTE — ED Provider Notes (Incomplete)
[facility] [place] EMERGENCY [place]PARTMENT Provider Note   CSN: [id number] Arrival date & time: 04/12/20  1735     History Chief Complaint  Patient presents with  . Abdominal Pain  . Hematemesis    Vincent Cook is a 55 y.o. male.  Vincent Cook is a 55 y.o. male with a history of eczema, erectile dysfunction, who presents to the emergency department for evaluation of epigastric pain, nausea and vomiting.  Patient reports last night he drank 1/5 of tequila, did cocaine and used Viagra, last use these around 5 AM and then went to bed.  Woke up around 1140 feeling poorly with nausea and epigastric pain and then began vomiting.  He reports he had 4 episodes of vomiting.  He reports at first his vomit looked kind of dark and then it seemed to lighten up, he did not see any bright red bleeding.  Reports he last vomited around 2 PM, still reports feeling nauseated with continued epigastric pain was afraid to eat or drink anything, because he felt like he would likely vomit again.  No diarrhea, constipation or blood in the stool.  No fevers or chills.  No chest pain or shortness of breath.  Reports he does not typically use these substances on a regular basis.  He has not tried any medicines at home to treat symptoms.  No other aggravating or alleviating factors.        Past Medical History:  Diagnosis Date  . Eczema   . Erectile dysfunction 2018  . Pneumonia 2008   Vincent Cook, 3 day hospitalization    Patient Active Problem List   Diagnosis Date Noted  . S/P knee surgery 12/10/2019  . Pain and swelling of left knee 12/10/2019  . Acute pain of left knee 12/10/2019  . Frequent urination 12/10/2019  . Screening for prostate cancer 07/07/2018  . Sleep disturbance 07/07/2018  . Snoring 07/07/2018  . Erectile dysfunction 07/07/2018  . Witnessed apneic spells 07/07/2018  . Fatigue 07/07/2018  . Vaccine counseling 07/07/2018  . Encounter for hepatitis C screening test for  low risk patient 07/07/2018    Past Surgical History:  Procedure Laterality Date  . KNEE SURGERY     left, ligament repair, Dr. Priscille Kluver  . TONSILLECTOMY         Family History  Problem Relation Age of Onset  . Diabetes Mother   . Other Father        natural causes  . Diabetes Brother   . Cancer Maternal Uncle        prostate  . Heart disease Neg Hx   . Hypertension Neg Hx   . Stroke Neg Hx     Social History   Tobacco Use  . Smoking status: Former Smoker    Packs/day: 1.00    Years: 8.00    Pack years: 8.00    Quit date: 02/25/2016    Years since quitting: 4.1  . Smokeless tobacco: Never Used  Vaping Use  . Vaping Use: Never used  Substance Use Topics  . Alcohol use: Yes    Alcohol/week: 6.0 standard drinks    Types: 6 Cans of beer per week    Comment: socially  . Drug use: Not Currently    Home Medications Prior to Admission medications   Medication Sig Start Date End Date Taking? Authorizing Provider  benzonatate (TESSALON) 100 MG capsule Take 1 capsule (100 mg total) by mouth every 8 (eight) hours. 01/29/20   Mannie Stabile, PA-C  benzonatate (TESSALON) 200 MG capsule Take 1 capsule (200 mg total) by mouth 3 (three) times daily as needed for cough. Patient not taking: No sig reported 10/23/19   Particia Nearing, PA-C  diclofenac (VOLTAREN) 75 MG EC tablet Take 1 tablet (75 mg total) by mouth 2 (two) times daily. 12/08/19   Elson Areas, PA-C  ibuprofen (ADVIL) 600 MG tablet Take 1 tablet (600 mg total) by mouth every 6 (six) hours as needed. 09/07/18   Law, Waylan Boga, PA-C  ibuprofen (ADVIL) 600 MG tablet Take 1 tablet (600 mg total) by mouth every 6 (six) hours as needed. 01/29/20   Mannie Stabile, PA-C  Multiple Vitamins-Minerals (EMERGEN-C IMMUNE PLUS) PACK Take 1 tablet by mouth 2 (two) times daily. 02/03/20   Tysinger, Kermit Balo, PA-C  ondansetron (ZOFRAN ODT) 4 MG disintegrating tablet Take 1 tablet (4 mg total) by mouth every 8 (eight) hours  as needed for nausea or vomiting. 01/29/20   Mannie Stabile, PA-C  predniSONE (DELTASONE) 20 MG tablet Take 2 tablets (40 mg total) by mouth daily with breakfast. 03/25/20   Particia Nearing, PA-C  promethazine-dextromethorphan (PROMETHAZINE-DM) 6.25-15 MG/5ML syrup Take 5 mLs by mouth 4 (four) times daily as needed for cough. Patient not taking: No sig reported 10/23/19   Particia Nearing, PA-C  sildenafil (VIAGRA) 100 MG tablet Take 1 tablet (100 mg total) by mouth as needed for erectile dysfunction. 09/29/19   Tysinger, Kermit Balo, PA-C  tamsulosin (FLOMAX) 0.4 MG CAPS capsule Take 1 capsule (0.4 mg total) by mouth daily. 12/10/19   Tysinger, Kermit Balo, PA-C  terbinafine (LAMISIL AT) 1 % cream Apply 1 application topically 2 (two) times daily. 03/05/19   Tysinger, Kermit Balo, PA-C  triamcinolone (KENALOG) 0.1 % APPLY A THIN LAYER TO AFFECTED AREA(S) TOPICALLY TWICE A DAY 03/30/20   Tysinger, Kermit Balo, PA-C    Allergies    Patient has no known allergies.  Review of Systems   Review of Systems  Constitutional: Negative for chills and fever.  HENT: Negative.   Respiratory: Negative for cough and shortness of breath.   Cardiovascular: Negative for chest pain.  Gastrointestinal: Positive for abdominal pain, nausea and vomiting. Negative for blood in stool, constipation and diarrhea.  Genitourinary: Negative for dysuria and frequency.  Musculoskeletal: Negative for arthralgias and myalgias.  Skin: Negative for color change and rash.  Neurological: Negative for dizziness, syncope and light-headedness.  All other systems reviewed and are negative.   Physical Exam Updated Vital Signs BP (!) 142/106 (BP Location: Right Arm)   Pulse (!) 110   Temp 98.3 F (36.8 C)   Resp 16   Ht 5\' 10"  (1.778 m)   Wt 95.3 kg   SpO2 95%   BMI 30.13 kg/m   Physical Exam Vitals and nursing note reviewed.  Constitutional:      General: He is not in acute distress.    Appearance: He is well-developed. He  is not ill-appearing or diaphoretic.     Comments: Well-appearing and in no distress   HENT:     Head: Normocephalic and atraumatic.  Eyes:     General:        Right eye: No discharge.        Left eye: No discharge.  Cardiovascular:     Rate and Rhythm: Normal rate and regular rhythm.     Heart sounds: Normal heart sounds. No murmur heard. No friction rub. No gallop.   Pulmonary:  Effort: Pulmonary effort is normal. No respiratory distress.     Breath sounds: Normal breath sounds. No wheezing or rales.     Comments: Respirations equal and unlabored, patient able to speak in full sentences, lungs clear to auscultation bilaterally  Abdominal:     General: Bowel sounds are normal. There is no distension.     Palpations: Abdomen is soft. There is no mass.     Tenderness: There is abdominal tenderness in the epigastric area. There is no guarding.     Comments: Abdomen is soft, nondistended, bowel sounds present there is some mild epigastric tenderness, all other quadrants nontender to palpation, no guarding or peritoneal signs.  Musculoskeletal:        General: No deformity.     Cervical back: Neck supple.  Skin:    General: Skin is warm and dry.     Capillary Refill: Capillary refill takes less than 2 seconds.  Neurological:     Mental Status: He is alert.     Coordination: Coordination normal.     Comments: Speech is clear, able to follow commands Moves extremities without ataxia, coordination intact  Psychiatric:        Mood and Affect: Mood normal.        Behavior: Behavior normal.     ED Results / Procedures / Treatments   Labs (all labs ordered are listed, but only abnormal results are displayed) Labs Reviewed  COMPREHENSIVE METABOLIC PANEL - Abnormal; Notable for the following components:      Result Value   Sodium 133 (*)    CO2 18 (*)    Glucose, Bld 202 (*)    Creatinine, Ser 1.35 (*)    Total Bilirubin 1.7 (*)    All other components within normal limits   CBC - Abnormal; Notable for the following components:   WBC 16.9 (*)    All other components within normal limits  URINALYSIS, ROUTINE W REFLEX MICROSCOPIC - Abnormal; Notable for the following components:   APPearance HAZY (*)    Glucose, UA 50 (*)    Ketones, ur 80 (*)    Protein, ur 30 (*)    Bacteria, UA RARE (*)    All other components within normal limits  LIPASE, BLOOD  RAPID URINE DRUG SCREEN, HOSP PERFORMED  TROPONIN I (HIGH SENSITIVITY)    EKG EKG Interpretation  Date/Time:  Wednesday April 12 2020 21:45:35 EDT Ventricular Rate:  103 PR Interval:    QRS Duration: 93 QT Interval:  332 QTC Calculation: 435 R Axis:   -42 Text Interpretation: Sinus tachycardia Left axis deviation Abnormal R-wave progression, early transition ST elevation, consider anterolateral injury Confirmed by Alona BeneLong, Joshua [phone]([id number]) on 04/12/2020 10:06:05 PM   Radiology DG Abd Acute W/Chest  Result Date: 04/12/2020 CLINICAL DATA:  Abdominal pain, nausea and vomiting, hematemesis EXAM: DG ABDOMEN ACUTE WITH 1 VIEW CHEST COMPARISON:  04/04/2003 FINDINGS: Supine and upright frontal views of the abdomen as well as an upright frontal view of the chest are obtained. Cardiac silhouette is unremarkable. No airspace disease, effusion, or pneumothorax. No acute bony abnormalities. The bowel gas pattern is unremarkable without obstruction or ileus. There are no masses or abnormal calcifications. Mild right convex scoliosis of the lumbar spine with multilevel spondylosis. No acute bony abnormality. IMPRESSION: 1. No acute intrathoracic process. 2. Unremarkable bowel gas pattern. Electronically Signed   By: Sharlet SalinaMichael  Brown M.D.   On: 04/12/2020 21:50    Procedures Procedures {Remember to document critical care time when appropriate:1}  Medications Ordered in ED Medications  sodium chloride 0.9 % bolus 1,000 mL (1,000 mLs Intravenous New Bag/Given 04/12/20 2221)  ondansetron (ZOFRAN) injection 4 mg (4 mg Intravenous  Given 04/12/20 2223)  famotidine (PEPCID) IVPB 20 mg premix (0 mg Intravenous Stopped 04/12/20 2319)  alum & mag hydroxide-simeth (MAALOX/MYLANTA) 200-200-20 MG/5ML suspension 30 mL (30 mLs Oral Given 04/12/20 2221)    And  lidocaine (XYLOCAINE) 2 % viscous mouth solution 15 mL (15 mLs Oral Given 04/12/20 2221)  sodium chloride 0.9 % bolus 1,000 mL (1,000 mLs Intravenous New Bag/Given 04/12/20 2319)    ED Course  I have reviewed the triage vital signs and the nursing notes.  Pertinent labs & imaging results that were available during my care of the patient were reviewed by me and considered in my medical decision making (see chart for details).  MDM Rules/Calculators/A&P                         55 year old male presents with epigastric pain, nausea and vomiting after drinking a large amount of tequila and using cocaine last night.  Patient reports when he woke up around 11:30 this morning he had 4 episodes of emesis, did not see any bright red blood but vomit was initially dark and then lightened, concerned that he may have had blood in his vomit but has not vomited since 2:30 this afternoon.    Final Clinical Impression(s) / ED Diagnoses Final diagnoses:  Epigastric pain  Non-intractable vomiting with nausea, unspecified vomiting type  Abnormal EKG    Rx / DC Orders ED Discharge Orders         Ordered    Ambulatory referral to Cardiology       Comments: Abnormal EKG   04/12/20 2340    ondansetron (ZOFRAN ODT) 4 MG disintegrating tablet        04/12/20 2345    famotidine (PEPCID) 20 MG tablet  2 times daily        04/12/20 2345

## 2020-04-12 NOTE — ED Triage Notes (Signed)
Pt presents today with c/o of abdominal pain that awoke him from sleep last night around midnight. He has had 4 episodes of hematemesis, denies diarrhea.

## 2020-04-13 LAB — TROPONIN I (HIGH SENSITIVITY): Troponin I (High Sensitivity): 7 ng/L (ref <18)

## 2020-04-21 ENCOUNTER — Other Ambulatory Visit: Payer: Self-pay | Admitting: Medical

## 2020-04-27 NOTE — Progress Notes (Signed)
Date:  04/28/2020   ID:  Vincent Cook, DOB May 07, 1965, MRN [id number]  PCP:  Jac Canavan, PA-C  Cardiologist: Tessa Lerner, DO, [facility] (established care 04/27/2020)  REASON FOR CONSULT: Abnormal ECG   REQUESTING PHYSICIAN:  Jodi Geralds, PA-C [facility], ED department  Chief Complaint  Patient presents with  . Abnormal ECG  . New Patient (Initial Visit)    HPI  Vincent Cook is a 55 y.o. male who presents to the office with a chief complaint of " abnormal EKG." Patient's past medical history and cardiovascular risk factors include: Erectile dysfunction, history of COVID-19 infection, alcohol use, former smoker, cocaine use.  Patient is referred to the office by Vincent Cook, ED physician assistant for evaluation of abnormal ECG.  Patient recently went to [facility], ED due to epigastric discomfort after consuming alcohol, cocaine, and Viagra.  When he went to the hospital his epigastric discomfort was 10 out of 10 and had essentially resolved by the time he was discharged.  He had high sensitive troponins checked x2 which were negative.  EKG showed normal sinus rhythm with ST changes suggestive of early repolarization abnormality.  Patient states that he has not had any reoccurrence of epigastric discomfort.  The discomfort did not improve with exertional activities and did not resolve with rest.  His last use of cocaine was approximately 5 days ago.  Prior to initiating Viagra for erectile dysfunction he has not undergone ischemic evaluation.  No family history of premature coronary disease or sudden cardiac death.  FUNCTIONAL STATUS: He is on his feet 12 hours a day at work but no structured exercise program or daily routine.   ALLERGIES: No Known Allergies  MEDICATION LIST PRIOR TO VISIT: Current Meds  Medication Sig  . sildenafil (VIAGRA) 100 MG tablet TAKE 1 TABLET BY MOUTH AS NEEDED FOR ERECTILE DYSFUNCTION  . triamcinolone (KENALOG) 0.1 % APPLY A THIN  LAYER TO AFFECTED AREA(S) TOPICALLY TWICE A DAY (Patient taking differently: Apply 1 application topically 2 (two) times daily as needed (rash).)     PAST MEDICAL HISTORY: Past Medical History:  Diagnosis Date  . Eczema   . Erectile dysfunction 2018  . Pneumonia 2008   Vincent Cook, 3 day hospitalization    PAST SURGICAL HISTORY: Past Surgical History:  Procedure Laterality Date  . KNEE SURGERY     left, ligament repair, Dr. Priscille Kluver  . TONSILLECTOMY      FAMILY HISTORY: The patient family history includes Cancer in his maternal uncle; Diabetes in his brother and mother; Other in his father.  SOCIAL HISTORY:  The patient  reports that he quit smoking about 4 years ago. He has a 8.00 pack-year smoking history. He has never used smokeless tobacco. He reports current alcohol use of about 6.0 standard drinks of alcohol per week. He reports previous drug use. Drug: Cocaine.  REVIEW OF SYSTEMS: Review of Systems  Constitutional: Negative for chills and fever.  HENT: Negative for hoarse voice and nosebleeds.   Eyes: Negative for discharge, double vision and pain.  Cardiovascular: Negative for chest pain, claudication, dyspnea on exertion, leg swelling, near-syncope, orthopnea, palpitations, paroxysmal nocturnal dyspnea and syncope.  Respiratory: Negative for hemoptysis and shortness of breath.   Musculoskeletal: Negative for muscle cramps and myalgias.  Gastrointestinal: Negative for abdominal pain, constipation, diarrhea, hematemesis, hematochezia, melena, nausea and vomiting.  Neurological: Negative for dizziness and light-headedness.    PHYSICAL EXAM: Vitals with BMI 04/28/2020 04/13/2020 04/13/2020  Height 5\' 10"  - -  Weight  210 lbs - -  BMI 30.13 - -  Systolic 124 113 413  Diastolic 81 65 69  Pulse 74 89 91    CONSTITUTIONAL: Well-developed and well-nourished. No acute distress.  SKIN: Skin is warm and dry. No rash noted. No cyanosis. No pallor. No jaundice HEAD:  Normocephalic and atraumatic.  EYES: No scleral icterus MOUTH/THROAT: Moist oral membranes.  NECK: No JVD present. No thyromegaly noted. No carotid bruits  LYMPHATIC: No visible cervical adenopathy.  CHEST Normal respiratory effort. No intercostal retractions  LUNGS: Clear to auscultation bilaterally.  No stridor. No wheezes. No rales.  CARDIOVASCULAR: Regular rate and rhythm, positive S1-S2, no murmurs rubs or gallops appreciated. ABDOMINAL: No apparent ascites.  EXTREMITIES: No peripheral edema  HEMATOLOGIC: No significant bruising NEUROLOGIC: Oriented to person, place, and time. Nonfocal. Normal muscle tone.  PSYCHIATRIC: Normal mood and affect. Normal behavior. Cooperative  CARDIAC DATABASE: EKG: 04/28/2020: Normal sinus rhythm, 73 bpm, IRBBB, without underlying ischemia or injury pattern.   Echocardiogram: No results found for this or any previous visit from the past 1095 days.   Stress Testing: No results found for this or any previous visit from the past 1095 days.  Heart Catheterization: None  LABORATORY DATA: CBC Latest Ref Rng & Units 04/12/2020 01/29/2020 07/07/2018  WBC 4.0 - 10.5 K/uL 16.9(H) 4.4 6.5  Hemoglobin 13.0 - 17.0 g/dL 24.4 01.0 27.2  Hematocrit 39.0 - 52.0 % 48.7 44.6 47.6  Platelets 150 - 400 K/uL 291 181 236    CMP Latest Ref Rng & Units 04/12/2020 01/29/2020 07/07/2018  Glucose 70 - 99 mg/dL 536(U) 440(H) 92  BUN 6 - 20 mg/dL 18 9 11   Creatinine 0.61 - 1.24 mg/dL ) 4.74(Q 5.95  Sodium 135 - 145 mmol/L 133(L) 134(L) 139  Potassium 3.5 - 5.1 mmol/L 4.5 3.3(L) 4.4  Chloride 98 - 111 mmol/L 101 100 103  CO2 22 - 32 mmol/L 18(L) 23 21  Calcium 8.9 - 10.3 mg/dL 9.2 6.38) 9.0  Total Protein 6.5 - 8.1 g/dL 8.1 - 7.4  Total Bilirubin 0.3 - 1.2 mg/dL 7.5(I) - 1.0  Alkaline Phos 38 - 126 U/L 62 - 61  AST 15 - 41 U/L 29 - 23  ALT 0 - 44 U/L 27 - 18    Lipid Panel  Lab Results  Component Value Date   CHOL 153 07/07/2018   HDL 57 07/07/2018   LDLCALC 46  07/07/2018   TRIG 248 (H) 07/07/2018   CHOLHDL 2.7 07/07/2018   BMP Recent Labs    01/29/20 1653 04/12/20 1745  NA 134* 133*  K 3.3* 4.5  CL 100 101  CO2 23 18*  GLUCOSE 175* 202*  BUN 9 18  CREATININE 1.16 1.35*  CALCIUM 8.4* 9.2  GFRNONAA >60 >60    HEMOGLOBIN A1C Lab Results  Component Value Date   HGBA1C 5.2 07/01/2013   MPG 103 07/01/2013    IMPRESSION:    ICD-10-CM   1. Epigastric pain  R10.13 EKG 12-Lead    PCV ECHOCARDIOGRAM COMPLETE    PCV CARDIAC STRESS TEST    CT CARDIAC SCORING (SELF PAY ONLY)    SARS-COV-2 RNA,(COVID-19) QUAL NAAT  2. History of COVID-19  Z86.16   3. Alcohol use  Z72.89   4. Former smoker  Z87.891   5. Cocaine abuse (HCC)  F14.10   6. Erectile dysfunction, unspecified erectile dysfunction type  N52.9      RECOMMENDATIONS: Vincent Cook is a 55 y.o. male whose past medical history and cardiac risk  factors include: Erectile dysfunction, history of COVID-19 infection, alcohol use, former smoker, cocaine use.  Epigastric discomfort: Patient is not having any active discomfort. He recently had gone to the [facility] emergency room department for chest pain/epigastric pain after consuming excessive alcohol, cocaine and Viagra. High sensitive troponins were negative x2 EKG in the hospital noted sinus rhythm with ST segment changes suggestive of early repolarization. Now presents for outpatient evaluation. Recommend exercise treadmill stress test and Covid screen prior to the study. Echocardiogram will be ordered to evaluate for structural heart disease and left ventricular systolic function. Coronary calcium score  Erectile dysfunction: Currently on Viagra on as needed basis.  Management per primary team.  Educated on complete cessation of cocaine and decreasing the consumption of alcohol.  Patient states that he has an annual physical with his PCP in May 2022 and will have a repeat lipid profile checked.  Will defer to primary team  at this time.  FINAL MEDICATION LIST END OF ENCOUNTER: No orders of the defined types were placed in this encounter.    Current Outpatient Medications:  .  sildenafil (VIAGRA) 100 MG tablet, TAKE 1 TABLET BY MOUTH AS NEEDED FOR ERECTILE DYSFUNCTION, Disp: 20 tablet, Rfl: 2 .  triamcinolone (KENALOG) 0.1 %, APPLY A THIN LAYER TO AFFECTED AREA(S) TOPICALLY TWICE A DAY (Patient taking differently: Apply 1 application topically 2 (two) times daily as needed (rash).), Disp: 45 g, Rfl: 1  Orders Placed This Encounter  Procedures  . SARS-COV-2 RNA,(COVID-19) QUAL NAAT  . CT CARDIAC SCORING (SELF PAY ONLY)  . PCV CARDIAC STRESS TEST  . EKG 12-Lead  . PCV ECHOCARDIOGRAM COMPLETE    There are no Patient Instructions on file for this visit.   --Continue cardiac medications as reconciled in final medication list. --Return in about 6 weeks (around 06/09/2020) for Reevaluation of epigastric discomfort and review test results. Or sooner if needed. --Continue follow-up with your primary care physician regarding the management of your other chronic comorbid conditions.  Patient's questions and concerns were addressed to his satisfaction. He voices understanding of the instructions provided during this encounter.   This note was created using a voice recognition software as a result there may be grammatical errors inadvertently enclosed that do not reflect the nature of this encounter. Every attempt is made to correct such errors.  Tessa Lerner, [place], [facility]  Pager: [phone] Office: [phone]

## 2020-04-28 ENCOUNTER — Other Ambulatory Visit: Payer: Self-pay

## 2020-04-28 ENCOUNTER — Encounter: Payer: Self-pay | Admitting: Cardiology

## 2020-04-28 ENCOUNTER — Ambulatory Visit: Payer: Managed Care, Other (non HMO) | Admitting: Cardiology

## 2020-04-28 VITALS — BP 124/81 | HR 74 | Temp 98.0°F | Resp 16 | Ht 70.0 in | Wt 210.0 lb

## 2020-04-28 DIAGNOSIS — N529 Male erectile dysfunction, unspecified: Secondary | ICD-10-CM

## 2020-04-28 DIAGNOSIS — R1013 Epigastric pain: Secondary | ICD-10-CM

## 2020-04-28 DIAGNOSIS — Z789 Other specified health status: Secondary | ICD-10-CM

## 2020-04-28 DIAGNOSIS — F1410 Cocaine abuse, uncomplicated: Secondary | ICD-10-CM

## 2020-04-28 DIAGNOSIS — Z7289 Other problems related to lifestyle: Secondary | ICD-10-CM

## 2020-04-28 DIAGNOSIS — Z87891 Personal history of nicotine dependence: Secondary | ICD-10-CM

## 2020-04-28 DIAGNOSIS — R9431 Abnormal electrocardiogram [ECG] [EKG]: Secondary | ICD-10-CM

## 2020-04-28 DIAGNOSIS — Z8616 Personal history of COVID-19: Secondary | ICD-10-CM

## 2020-05-25 ENCOUNTER — Ambulatory Visit: Payer: Managed Care, Other (non HMO)

## 2020-05-25 ENCOUNTER — Other Ambulatory Visit: Payer: Self-pay

## 2020-05-25 DIAGNOSIS — R1013 Epigastric pain: Secondary | ICD-10-CM

## 2020-06-02 ENCOUNTER — Encounter: Payer: Managed Care, Other (non HMO) | Admitting: Medical

## 2020-06-05 ENCOUNTER — Ambulatory Visit: Payer: Managed Care, Other (non HMO) | Admitting: Medical

## 2020-06-07 ENCOUNTER — Telehealth: Payer: Self-pay | Admitting: Medical

## 2020-06-07 ENCOUNTER — Ambulatory Visit: Payer: Managed Care, Other (non HMO) | Admitting: Medical

## 2020-06-07 NOTE — Telephone Encounter (Signed)
This patient no showed for their appointment today.Which of the following is necessary for this patient.   A) No follow-up necessary   B) Follow-up urgent. Locate Patient Immediately.   C) Follow-up necessary. Contact patient and Schedule visit in ____ Days.   D) Follow-up Advised. Contact patient and Schedule visit in ____ Days.   E) Please Send no show letter to patient. Charge no show fee if no show was a CPE  He did not show today and this is the 3rd time on scheule I think this week for the same!!!

## 2020-06-07 NOTE — Telephone Encounter (Signed)
I can send pt letter. I can not sent pt a no show fee since this was not a cpe. And he also canceled cpe last week   Mayve we should get diana to look into this

## 2020-06-12 NOTE — Telephone Encounter (Signed)
Please send patient warning letter for NO SHOWS.

## 2020-06-13 ENCOUNTER — Encounter: Payer: Self-pay | Admitting: Medical

## 2020-09-18 ENCOUNTER — Other Ambulatory Visit: Payer: Self-pay | Admitting: Medical

## 2020-09-18 NOTE — Telephone Encounter (Signed)
Sent message to patient that he is overdue for physical

## 2020-11-10 ENCOUNTER — Other Ambulatory Visit: Payer: Self-pay | Admitting: Medical

## 2021-02-01 ENCOUNTER — Other Ambulatory Visit: Payer: Self-pay | Admitting: Medical

## 2021-02-02 ENCOUNTER — Other Ambulatory Visit: Payer: Self-pay | Admitting: Medical

## 2021-02-02 ENCOUNTER — Telehealth: Payer: Self-pay | Admitting: Medical

## 2021-02-02 MED ORDER — SILDENAFIL CITRATE 100 MG PO TABS: ORAL_TABLET | ORAL | 0 refills | Status: DC

## 2021-02-02 MED ORDER — SILDENAFIL CITRATE 100 MG PO TABS
ORAL_TABLET | ORAL | 0 refills | Status: DC
Start: 2021-02-02 — End: 2021-02-23

## 2021-02-02 NOTE — Telephone Encounter (Signed)
Pt is requesting a refill on Sildenafil sent to the [facility] on [address]

## 2021-02-02 NOTE — Telephone Encounter (Signed)
He is scheduled for CPE on 1/17 with you. The medication was sent to the wrong pharmacy it needs to go [facility] on [address]

## 2021-02-13 ENCOUNTER — Encounter: Payer: Managed Care, Other (non HMO) | Admitting: Medical

## 2021-02-20 ENCOUNTER — Other Ambulatory Visit: Payer: Self-pay

## 2021-02-20 ENCOUNTER — Ambulatory Visit (INDEPENDENT_AMBULATORY_CARE_PROVIDER_SITE_OTHER): Payer: Managed Care, Other (non HMO) | Admitting: Medical

## 2021-02-20 ENCOUNTER — Encounter: Payer: Self-pay | Admitting: Medical

## 2021-02-20 VITALS — BP 120/80 | HR 77 | Ht 71.0 in | Wt 202.0 lb

## 2021-02-20 DIAGNOSIS — M62830 Muscle spasm of back: Secondary | ICD-10-CM | POA: Diagnosis not present

## 2021-02-20 DIAGNOSIS — Z0000 Encounter for general adult medical examination without abnormal findings: Secondary | ICD-10-CM | POA: Diagnosis not present

## 2021-02-20 DIAGNOSIS — M545 Low back pain, unspecified: Secondary | ICD-10-CM

## 2021-02-20 DIAGNOSIS — Z1211 Encounter for screening for malignant neoplasm of colon: Secondary | ICD-10-CM

## 2021-02-20 DIAGNOSIS — Z9889 Other specified postprocedural states: Secondary | ICD-10-CM

## 2021-02-20 DIAGNOSIS — Z125 Encounter for screening for malignant neoplasm of prostate: Secondary | ICD-10-CM

## 2021-02-20 DIAGNOSIS — Z720 Tobacco use: Secondary | ICD-10-CM

## 2021-02-20 DIAGNOSIS — Z131 Encounter for screening for diabetes mellitus: Secondary | ICD-10-CM

## 2021-02-20 DIAGNOSIS — N529 Male erectile dysfunction, unspecified: Secondary | ICD-10-CM

## 2021-02-20 DIAGNOSIS — Z13220 Encounter for screening for lipoid disorders: Secondary | ICD-10-CM

## 2021-02-20 DIAGNOSIS — Z7185 Encounter for immunization safety counseling: Secondary | ICD-10-CM

## 2021-02-20 MED ORDER — TIZANIDINE HCL 4 MG PO TABS: 4.0000 mg | ORAL_TABLET | Freq: Four times a day (QID) | ORAL | 0 refills | Status: DC | PRN

## 2021-02-20 NOTE — Progress Notes (Signed)
Subjective:   HPI  Vincent Cook is a 56 y.o. male who presents for Chief Complaint  Patient presents with   fasting cpe    Fasting cpe, swelling in back due to knee declines all vaccines    Patient Care Team: Aolanis Crispen, Cleda Mccreedy as PCP - General (Family Medicine) Sees dentist Sees eye doctor Dr. Turner Daniels, [facility]   Concerns: He notes recently having low back pain on the right.  He thinks is because he is favoring his right leg due to the ongoing pain with the left knee.  He had surgery last July on the knee, meniscus repair.  This makes the third surgery on that knee.  Still having a lot of problems with the knee.  Goes back soon to see Ortho.  No numbness or tingling in legs but he does have pain that sometimes goes down the right leg.  He has been out of work for 6 months.  He is not sure when he can return to work.  Lives with alone, currently on long-term disability.   Reviewed their medical, surgical, family, social, medication, and allergy history and updated chart as appropriate.  Past Medical History:  Diagnosis Date   Eczema    Erectile dysfunction 2018   Pneumonia 2008   , 3 day hospitalization    Past Surgical History:  Procedure Laterality Date   KNEE SURGERY     left, ligament repair, Dr. Priscille Kluver   TONSILLECTOMY      Family History  Problem Relation Age of Onset   Diabetes Mother    Other Father        natural causes   Diabetes Brother    Cancer Maternal Uncle        prostate   Heart disease Neg Hx    Hypertension Neg Hx    Stroke Neg Hx      Current Outpatient Medications:    sildenafil (VIAGRA) 100 MG tablet, TAKE ONE TABLET BY MOUTH AS NEEDED FOR ERECTILE DYSFUNCTION, Disp: 20 tablet, Rfl: 0   tiZANidine (ZANAFLEX) 4 MG tablet, Take 1 tablet (4 mg total) by mouth every 6 (six) hours as needed for muscle spasms., Disp: 30 tablet, Rfl: 0   triamcinolone (KENALOG) 0.1 %, APPLY A THIN LAYER TO AFFECTED AREA(S) TOPICALLY  TWICE A DAY (Patient taking differently: Apply 1 application topically 2 (two) times daily as needed (rash).), Disp: 45 g, Rfl: 1  No Known Allergies   Review of Systems Constitutional: -fever, -chills, -sweats, -unexpected weight change, -decreased appetite, -fatigue Allergy: -sneezing, -itching, -congestion Dermatology: -changing moles, --rash, -lumps ENT: -runny nose, -ear pain, -sore throat, -hoarseness, -sinus pain, -teeth pain, - ringing in ears, -hearing loss, -nosebleeds Cardiology: -chest pain, -palpitations, -swelling, -difficulty breathing when lying flat, -waking up short of breath Respiratory: -cough, -shortness of breath, -difficulty breathing with exercise or exertion, -wheezing, -coughing up blood Gastroenterology: -abdominal pain, -nausea, -vomiting, -diarrhea, -constipation, -blood in stool, -changes in bowel movement, -difficulty swallowing or eating Hematology: -bleeding, -bruising  Musculoskeletal: +joint aches, -muscle aches, -joint swelling, +back pain, -neck pain, -cramping, -changes in gait Ophthalmology: denies vision changes, eye redness, itching, discharge Urology: -burning with urination, -difficulty urinating, -blood in urine, -urinary frequency, -urgency, -incontinence Neurology: -headache, -weakness, -tingling, -numbness, -memory loss, -falls, -dizziness Psychology: -depressed mood, -agitation, -sleep problems Male GU: no testicular mass, pain, no lymph nodes swollen, no swelling, no rash.  Depression screen [facility] 2/9 02/20/2021 12/10/2019 07/07/2018  Decreased Interest 0 0 0  Down, Depressed, Hopeless 0 0  0  PHQ - 2 Score 0 0 0  Altered sleeping - 0 -  Tired, decreased energy - 0 -  Change in appetite - 0 -  Feeling bad or failure about yourself  - 0 -  Trouble concentrating - 0 -  Moving slowly or fidgety/restless - 0 -  Suicidal thoughts - 0 -  PHQ-9 Score - 0 -        Objective:  BP 120/80    Pulse 77    Ht 5\' 11"  (1.803 m)    Wt 202 lb (91.6 kg)     BMI 28.17 kg/m   General appearance: alert, no distress, WD/WN, African American male Skin: unremarkable HEENT: normocephalic, conjunctiva/corneas normal, sclerae anicteric, PERRLA, EOMi, nares patent, no discharge or erythema, pharynx normal Oral cavity: MMM, tongue normal, teeth normal Neck: supple, no lymphadenopathy, no thyromegaly, no masses, normal ROM, no bruits Chest: non tender, normal shape and expansion Heart: RRR, normal S1, S2, no murmurs Lungs: CTA bilaterally, no wheezes, rhonchi, or rales Abdomen: +bs, soft, non tender, non distended, no masses, no hepatomegaly, no splenomegaly, no bruits Back: +spasm and tendnerss right lumbar paraspinal region, otherwise non tender, somewhat decreased ROM due to pain, no scoliosis Musculoskeletal: somewhat guarded of left knee and standing with weight shifted to right leg, upper extremities non tender, no obvious deformity, normal ROM throughout, lower extremities non tender, no obvious deformity, normal ROM throughout Extremities: no edema, no cyanosis, no clubbing Pulses: 2+ symmetric, upper and lower extremities, normal cap refill Neurological: alert, oriented x 3, CN2-12 intact, strength normal upper extremities and lower extremities, sensation normal throughout, DTRs 2+ throughout, no cerebellar signs, gait normal Psychiatric: normal affect, behavior normal, pleasant  GU/rectal - declined   Assessment and Plan :   Encounter Diagnoses  Name Primary?   Encounter for health maintenance examination in adult Yes   Screen for colon cancer    Screening for prostate cancer    Back spasm    Vaccine counseling    S/P knee surgery    Erectile dysfunction, unspecified erectile dysfunction type    Screening for diabetes mellitus    Screening for lipid disorders    Acute right-sided low back pain without sciatica     This visit was a preventative care visit, also known as wellness visit or routine physical.   Topics typically include  healthy lifestyle, diet, exercise, preventative care, vaccinations, sick and well care, proper use of emergency dept and after hours care, as well as other concerns.     Recommendations: Continue to return yearly for your annual wellness and preventative care visits.  This gives a chance to discuss healthy lifestyle, exercise, vaccinations, review your chart record, and perform screenings where appropriate.  I recommend you see your eye doctor yearly for routine vision care.  I recommend you see your dentist yearly for routine dental care including hygiene visits twice yearly.   Vaccination recommendations were reviewed Immunization History  Administered Date(s) Administered   Pneumococcal Polysaccharide-23 07/01/2013   Tdap 07/01/2013   Counseled on flu, covid and Shingrix vaccines  Declines vaccines today   Screening for cancer: Colon cancer screening: We will refer you for Cologuard   We discussed PSA, prostate exam, and prostate cancer screening risks/benefits.     Skin cancer screening: Check your skin regularly for new changes, growing lesions, or other lesions of concern Come in for evaluation if you have skin lesions of concern.  Lung cancer screening: If you have a greater than  20 pack year history of tobacco use, then you may qualify for lung cancer screening with a chest CT scan.   Please call your insurance company to inquire about coverage for this test.  We currently don't have screenings for other cancers besides breast, cervical, colon, and lung cancers.  If you have a strong family history of cancer or have other cancer screening concerns, please let me know.    Bone health: Get at least 150 minutes of aerobic exercise weekly Get weight bearing exercise at least once weekly Bone density test:  A bone density test is an imaging test that uses a type of X-ray to measure the amount of calcium and other minerals in your bones. The test may be used to  diagnose or screen you for a condition that causes weak or thin bones (osteoporosis), predict your risk for a broken bone (fracture), or determine how well your osteoporosis treatment is working. The bone density test is recommended for females 65 and older, or females or males <65 if certain risk factors such as thyroid disease, long term use of steroids such as for asthma or rheumatological issues, vitamin D deficiency, estrogen deficiency, family history of osteoporosis, self or family history of fragility fracture in first degree relative.    Heart health: Get at least 150 minutes of aerobic exercise weekly Limit alcohol It is important to maintain a healthy blood pressure and healthy cholesterol numbers  Heart disease screening: Screening for heart disease includes screening for blood pressure, fasting lipids, glucose/diabetes screening, BMI height to weight ratio, reviewed of smoking status, physical activity, and diet.    Goals include blood pressure 120/80 or less, maintaining a healthy lipid/cholesterol profile, preventing diabetes or keeping diabetes numbers under good control, not smoking or using tobacco products, exercising most days per week or at least 150 minutes per week of exercise, and eating healthy variety of fruits and vegetables, healthy oils, and avoiding unhealthy food choices like fried food, fast food, high sugar and high cholesterol foods.    Other tests may possibly include EKG test, CT coronary calcium score, echocardiogram, exercise treadmill stress test.   Consider CT coronary calcium test   Medical care options: I recommend you continue to seek care here first for routine care.  We try really hard to have available appointments Monday through Friday daytime hours for sick visits, acute visits, and physicals.  Urgent care should be used for after hours and weekends for significant issues that cannot wait till the next day.  The emergency department should be used  for significant potentially life-threatening emergencies.  The emergency department is expensive, can often have long wait times for less significant concerns, so try to utilize primary care, urgent care, or telemedicine when possible to avoid unnecessary trips to the emergency department.  Virtual visits and telemedicine have been introduced since the pandemic started in 2020, and can be convenient ways to receive medical care.  We offer virtual appointments as well to assist you in a variety of options to seek medical care.   Separate significant issues discussed: Back spasm and pain - advised stretching, heat therapy, OTC NSAID short term, tizanidine prn but caution with sedation, go see massage therapy.  F/u if not improving in the next 3-4 weeks  Tobacco use - advised cessation  Knee pain, s/p knee surgery - f/u with ortho as planned  ED - no recent c/o    Demarie was seen today for fasting cpe.  Diagnoses and all orders for  this visit:  Encounter for health maintenance examination in adult -     Comprehensive metabolic panel -     CBC -     Lipid panel -     Hemoglobin A1c -     PSA -     Urinalysis, Routine w reflex microscopic -     Cologuard  Screen for colon cancer -     Cologuard  Screening for prostate cancer -     PSA  Back spasm  Vaccine counseling  S/P knee surgery  Erectile dysfunction, unspecified erectile dysfunction type  Screening for diabetes mellitus -     Hemoglobin A1c  Screening for lipid disorders -     Lipid panel  Acute right-sided low back pain without sciatica  Other orders -     tiZANidine (ZANAFLEX) 4 MG tablet; Take 1 tablet (4 mg total) by mouth every 6 (six) hours as needed for muscle spasms.    Follow-up pending labs, yearly for physical

## 2021-02-20 NOTE — Patient Instructions (Signed)
Massage Therapy:  Sharen Hint [facility] [address] [place], [place] [address] [phone] [email or web address] [facility] [address] [place], [place] [address] [phone] [email or web address]

## 2021-02-21 LAB — COMPREHENSIVE METABOLIC PANEL WITH GFR
ALT: 21 IU/L (ref 0–44)
AST: 22 IU/L (ref 0–40)
Albumin/Globulin Ratio: 1.5 (ref 1.2–2.2)
Albumin: 4.3 g/dL (ref 3.8–4.9)
Alkaline Phosphatase: 66 IU/L (ref 44–121)
BUN/Creatinine Ratio: 8 — ABNORMAL LOW (ref 9–20)
BUN: 7 mg/dL (ref 6–24)
Bilirubin Total: 0.8 mg/dL (ref 0.0–1.2)
CO2: 21 mmol/L (ref 20–29)
Calcium: 9 mg/dL (ref 8.7–10.2)
Chloride: 103 mmol/L (ref 96–106)
Creatinine, Ser: 0.92 mg/dL (ref 0.76–1.27)
Globulin, Total: 2.9 g/dL (ref 1.5–4.5)
Glucose: 126 mg/dL — ABNORMAL HIGH (ref 70–99)
Potassium: 4.1 mmol/L (ref 3.5–5.2)
Sodium: 139 mmol/L (ref 134–144)
Total Protein: 7.2 g/dL (ref 6.0–8.5)
eGFR: 97 mL/min/1.73

## 2021-02-21 LAB — URINALYSIS, ROUTINE W REFLEX MICROSCOPIC
Bilirubin, UA: NEGATIVE
Glucose, UA: NEGATIVE
Ketones, UA: NEGATIVE
Leukocytes,UA: NEGATIVE
Nitrite, UA: NEGATIVE
Protein,UA: NEGATIVE
RBC, UA: NEGATIVE
Specific Gravity, UA: 1.006 (ref 1.005–1.030)
Urobilinogen, Ur: 0.2 mg/dL (ref 0.2–1.0)
pH, UA: 6.5 (ref 5.0–7.5)

## 2021-02-21 LAB — CBC
Hematocrit: 47.7 % (ref 37.5–51.0)
Hemoglobin: 16.6 g/dL (ref 13.0–17.7)
MCH: 32.9 pg (ref 26.6–33.0)
MCHC: 34.8 g/dL (ref 31.5–35.7)
MCV: 95 fL (ref 79–97)
Platelets: 205 x10E3/uL (ref 150–450)
RBC: 5.04 x10E6/uL (ref 4.14–5.80)
RDW: 12.1 % (ref 11.6–15.4)
WBC: 5.7 x10E3/uL (ref 3.4–10.8)

## 2021-02-21 LAB — LIPID PANEL
Chol/HDL Ratio: 2.7 ratio (ref 0.0–5.0)
Cholesterol, Total: 171 mg/dL (ref 100–199)
HDL: 63 mg/dL
LDL Chol Calc (NIH): 86 mg/dL (ref 0–99)
Triglycerides: 124 mg/dL (ref 0–149)
VLDL Cholesterol Cal: 22 mg/dL (ref 5–40)

## 2021-02-21 LAB — HEMOGLOBIN A1C
Est. average glucose Bld gHb Est-mCnc: 111 mg/dL
Hgb A1c MFr Bld: 5.5 % (ref 4.8–5.6)

## 2021-02-21 LAB — PSA: Prostate Specific Ag, Serum: 1 ng/mL (ref 0.0–4.0)

## 2021-02-23 ENCOUNTER — Other Ambulatory Visit: Payer: Self-pay | Admitting: Medical

## 2021-03-02 ENCOUNTER — Telehealth: Payer: Self-pay | Admitting: Medical

## 2021-03-02 MED ORDER — TAMSULOSIN HCL 0.4 MG PO CAPS: 0.4000 mg | ORAL_CAPSULE | Freq: Every day | ORAL | 0 refills | Status: DC

## 2021-03-02 NOTE — Telephone Encounter (Signed)
Think Vincenza Hews meant to send this to you

## 2021-03-02 NOTE — Telephone Encounter (Signed)
Pt is requesting refill on Flomax. [facility] [place] [facility] [address]

## 2021-03-02 NOTE — Telephone Encounter (Signed)
done

## 2021-03-29 ENCOUNTER — Other Ambulatory Visit: Payer: Self-pay | Admitting: Medical

## 2021-04-21 ENCOUNTER — Other Ambulatory Visit: Payer: Self-pay | Admitting: Medical

## 2021-05-16 ENCOUNTER — Ambulatory Visit: Payer: Managed Care, Other (non HMO) | Admitting: Medical

## 2021-05-16 VITALS — BP 110/70 | HR 90 | Temp 97.5°F | Wt 201.0 lb

## 2021-05-16 DIAGNOSIS — M62838 Other muscle spasm: Secondary | ICD-10-CM

## 2021-05-16 DIAGNOSIS — M5441 Lumbago with sciatica, right side: Secondary | ICD-10-CM | POA: Diagnosis not present

## 2021-05-16 DIAGNOSIS — G8929 Other chronic pain: Secondary | ICD-10-CM | POA: Diagnosis not present

## 2021-05-16 NOTE — Patient Instructions (Signed)
Please go to [facility] for your lumbar xray.   Their hours are 8am - 4:30 pm Monday - Friday.  Take your insurance card with you.  [facility] [phone]  [address], [address] [place], [place] [address]  [address] [place], [place] [address] 

## 2021-05-16 NOTE — Progress Notes (Signed)
Subjective:  ? ? Vincent Cook is a 56 y.o. male who presents for evaluation of low back pain.  ?Chief Complaint  ?Patient presents with  ? back pain  ?  Back pain - constant pain- since December. Had knee surgery and favoring one side more and now that side hurts  ? ?Here for ongoing back pain.  He had some pain concerns in 12/2020.   Still has ongoing pain.  If standing for long periods, gets burning pain down right lateral leg even down to lower leg.   Sometimes gets spasm in back.  Hurts in low back and leg.   No specific injury, trauma or fall.  Lying in certain positions can aggravated the pain.   Lying on left side or putting pillow between legs typically helps.   Has tried muscle relaxer, OTC analgesic, and nothing helps all that much.   No fever.   Has had some tingling in right leg some.  No numbness.   No swelling.  Had left knee surgery 07/2020 been favoring right leg since, surgery with Dr. Turner Daniels.  No other aggravating or relieving factors. No other complaint. ? ?The following portions of the patient's history were reviewed and updated as appropriate: allergies, current medications, past family history, past medical history, past social history, past surgical history and problem list. ? ?Review of Systems ?Constitutional: denies fever, chills, sweats, unexpected weight change, anorexia, fatigue ?Dermatology: rash, bruising, redness ?Cardiology: denies chest pain, palpitations, edema, orthopnea, paroxysmal nocturnal dyspnea ?Respiratory: denies cough, shortness of breath, dyspnea on exertion, wheezing, hemoptysis ?Gastroenterology: denies abdominal pain, nausea, vomiting, diarrhea, constipation, blood in stool, changes in bowel movement, dysphagia ?Musculoskeletal: +ongoing left knee pain, no joint swelling, neck pain, cramping ?Urology: denies dysuria, difficulty urinating, hematuria, urinary frequency, urgency, incontinence ?Neurology: no headache, weakness, tingling, numbness, speech abnormality,  memory loss, falls, dizziness ? ?  ?  ?Objective:  ?  ?BP 110/70   Pulse 90   Temp (!) 97.5 ?F (36.4 ?C)   Wt 201 lb (91.2 kg)   BMI 28.03 kg/m?  ? ?General appearance: alert, no distress, WD/WN ?Tender right lumbar spine, +spasm right lumbar region, otherwise back nontender, ROM somewhat slow but relatively full, no obvious scoliosis ?MSK: UE nontender, normal ROM, LE nontender, normal ROM, no obvious deformity ?Neuro: normal DTRs and strength, normal sensation, -slr ?Extremities: no edema, no cyanosis, no clubbing ?Pulses: 2+ symmetric, upper and lower extremities ? ?  ? ?Assessment:  ? ?Encounter Diagnoses  ?Name Primary?  ? Chronic right-sided low back pain with right-sided sciatica Yes  ? Muscle spasm   ? ? ?  ?Plan:  ? ?Discussed concerns, ongoing pains.   ?Go for xray baseline ?Continue to work on stretching. ?Consider trial of gabapentin ?For now can use OTC analgesic prn ? ?Vincent Cook was seen today for back pain. ? ?Diagnoses and all orders for this visit: ? ?Chronic right-sided low back pain with right-sided sciatica ?-     DG Lumbar Spine Complete; Future ? ?Muscle spasm ?-     DG Lumbar Spine Complete; Future ? ? ? ?Follow up: pending xray ? ?

## 2021-05-17 ENCOUNTER — Ambulatory Visit
Admission: RE | Admit: 2021-05-17 | Discharge: 2021-05-17 | Disposition: A | Discharge status: Home / Self Care | Payer: Managed Care, Other (non HMO) | Source: Ambulatory Visit | Attending: Medical | Admitting: Medical

## 2021-05-17 DIAGNOSIS — G8929 Other chronic pain: Secondary | ICD-10-CM

## 2021-05-17 DIAGNOSIS — M62838 Other muscle spasm: Secondary | ICD-10-CM

## 2021-05-21 ENCOUNTER — Telehealth: Payer: Self-pay

## 2021-05-21 ENCOUNTER — Other Ambulatory Visit: Payer: Self-pay | Admitting: Medical

## 2021-05-21 DIAGNOSIS — M419 Scoliosis, unspecified: Secondary | ICD-10-CM

## 2021-05-21 DIAGNOSIS — M545 Low back pain, unspecified: Secondary | ICD-10-CM

## 2021-05-21 DIAGNOSIS — M5136 Other intervertebral disc degeneration, lumbar region: Secondary | ICD-10-CM

## 2021-05-21 NOTE — Telephone Encounter (Signed)
Pt called and results of back Xray given. He said he would like to see a specialist  to discuss further treatment. ?

## 2021-05-24 ENCOUNTER — Other Ambulatory Visit: Payer: Self-pay | Admitting: Medical

## 2021-05-24 ENCOUNTER — Telehealth: Payer: Self-pay

## 2021-05-24 NOTE — Telephone Encounter (Signed)
Pt left VM needed a refill on Sildenafil. He is asking if the dose can be increased also ?

## 2021-05-25 ENCOUNTER — Other Ambulatory Visit: Payer: Self-pay | Admitting: Medical

## 2021-06-08 ENCOUNTER — Ambulatory Visit: Payer: Managed Care, Other (non HMO) | Admitting: Medical

## 2021-06-08 ENCOUNTER — Telehealth (INDEPENDENT_AMBULATORY_CARE_PROVIDER_SITE_OTHER): Payer: Managed Care, Other (non HMO) | Admitting: Medical

## 2021-06-08 VITALS — Wt 200.0 lb

## 2021-06-08 DIAGNOSIS — M62830 Muscle spasm of back: Secondary | ICD-10-CM | POA: Diagnosis not present

## 2021-06-08 DIAGNOSIS — M5410 Radiculopathy, site unspecified: Secondary | ICD-10-CM | POA: Diagnosis not present

## 2021-06-08 DIAGNOSIS — M5440 Lumbago with sciatica, unspecified side: Secondary | ICD-10-CM | POA: Diagnosis not present

## 2021-06-08 MED ORDER — HYDROCODONE-ACETAMINOPHEN 10-325 MG PO TABS: 0.5000 | ORAL_TABLET | Freq: Two times a day (BID) | ORAL | 0 refills | Status: DC | PRN

## 2021-06-08 MED ORDER — DIAZEPAM 10 MG PO TABS: 10.0000 mg | ORAL_TABLET | Freq: Two times a day (BID) | ORAL | 0 refills | Status: AC | PRN

## 2021-06-08 MED ORDER — PREDNISONE 10 MG PO TABS: ORAL_TABLET | ORAL | 0 refills | Status: AC

## 2021-06-08 NOTE — Progress Notes (Signed)
Subjective:  ? ?This visit type was conducted due to national recommendations for restrictions regarding the COVID-19 Pandemic (e.g. social distancing) in an effort to limit this patient's exposure and mitigate transmission in our community.  Due to their co-morbid illnesses, this patient is at least at moderate risk for complications without adequate follow up.  This format is felt to be most appropriate for this patient at this time.   ? ?Documentation for virtual audio and video telecommunications through [facility] encounter: ? ?The patient was located at home. ?The provider was located in the office. ?The patient did consent to this visit and is aware of possible charges through their insurance for this visit. ? ?The other persons participating in this telemedicine service were none. ?Time spent on call was 15 minutes and in review of previous records >20 minutes total. ? ?This virtual service is not related to other E/M service within previous 7 days. ? ? ? Vincent Cook is a 56 y.o. male who presents for evaluation of low back pain.  ?Chief Complaint  ?Patient presents with  ? back pain  ?  Back pain x 3 days. Seeing ortho on may 26th but can't wait until then  ? ?Virtual consult for ongoing back pain.  Worse flare up today.  Lying in the bed doesn't help.  Feels knot/spasm in back.  He notes ROM decreased, sore and spasms.   Burning sesnations down right leg.  No fever.   No blood in urine or stool.  No incontinence.   ? ?I have seen him recently for ongoing back pain and sciatica type pain.  Pain started in December 2022..   Still has ongoing pain.  If standing for long periods, gets burning pain down right lateral leg even down to lower leg.   Sometimes gets spasm in back.  Hurts in low back and leg.   No specific injury, trauma or fall.  Lying in certain positions can aggravated the pain.   Lying on left side or putting pillow between legs typically helps.   Has tried muscle relaxer, OTC analgesic, and  nothing helps all that much.   No fever.   Has had some tingling in right leg some.  No numbness.   No swelling.  Had left knee surgery 07/2020 been favoring right leg since, surgery with Dr. Mayer Cook.  No other aggravating or relieving factors. No other complaint. ? ?The following portions of the patient's history were reviewed and updated as appropriate: allergies, current medications, past family history, past medical history, past social history, past surgical history and problem list. ? ?Review of Systems ?Constitutional: denies fever, chills, sweats, unexpected weight change, anorexia, fatigue ?Dermatology: rash, bruising, redness ?Cardiology: denies chest pain, palpitations, edema, orthopnea, paroxysmal nocturnal dyspnea ?Respiratory: denies cough, shortness of breath, dyspnea on exertion, wheezing, hemoptysis ?Gastroenterology: denies abdominal pain, nausea, vomiting, diarrhea, constipation, blood in stool, changes in bowel movement, dysphagia ?Musculoskeletal: +ongoing left knee pain, no joint swelling, neck pain, cramping ?Urology: denies dysuria, difficulty urinating, hematuria, urinary frequency, urgency, incontinence ?Neurology: no headache, weakness, tingling, numbness, speech abnormality, memory loss, falls, dizziness ? ?  ?  ?Objective:  ?  ?Wt 200 lb (90.7 kg)   BMI 27.89 kg/m?  ? ?General appearance: alert, no distress, WD/WN ?Not examined in person as this is a virtual consult ? ?  ? ?Assessment:  ? ?Encounter Diagnoses  ?Name Primary?  ? Back spasm Yes  ? Low back pain with sciatica, sciatica laterality unspecified, unspecified back pain laterality, unspecified chronicity   ?  Radicular pain   ? ? ? ?  ?Plan:  ? ?We reviewed his recent lumbar x-ray.  There was concern for foraminal narrowing and stenosis as well as degenerative changes.  We have already referred him to orthopedics but the appointment is about another 2 weeks out ? ?In the meantime advise rest, avoid heavy lifting or injury. ? ?Begin  vitamin D next few days, begin prednisone Dosepak.  For worse breakthrough pain can use Norco short-term.  Avoid taking Norco and Valium within 4 hours of each other. ? ?We discussed risk and benefits of medication, proper use of medication.  Avoid NSAIDs while on the prednisone. ? ?Vincent Cook was seen today for back pain. ? ?Diagnoses and all orders for this visit: ? ?Back spasm ? ?Low back pain with sciatica, sciatica laterality unspecified, unspecified back pain laterality, unspecified chronicity ? ?Radicular pain ? ?Other orders ?-     predniSONE (DELTASONE) 10 MG tablet; 6 tablets all together day 1, 5 tablets day 2, 4 tablets day 3, 3 tablets day 4, 2 tablets day 5, 1 tablet day 6. ?-     diazepam (VALIUM) 10 MG tablet; Take 1 tablet (10 mg total) by mouth every 12 (twelve) hours as needed for anxiety. Caution - sedation ?-     HYDROcodone-acetaminophen (NORCO) 10-325 MG tablet; Take 0.5 tablets by mouth 2 (two) times daily as needed. Caution - sedation ? ? ? ? ?Follow up: pending orthopedics ? ?

## 2021-06-19 ENCOUNTER — Telehealth: Payer: Self-pay | Admitting: Medical

## 2021-06-19 NOTE — Telephone Encounter (Signed)
Pt called requesting a refill for his norco [id number] - [place], [place] - [address]

## 2021-06-20 ENCOUNTER — Other Ambulatory Visit: Payer: Self-pay | Admitting: Medical

## 2021-06-20 MED ORDER — HYDROCODONE-ACETAMINOPHEN 10-325 MG PO TABS
0.5000 | ORAL_TABLET | Freq: Two times a day (BID) | ORAL | 0 refills | Status: AC | PRN
Start: 2021-06-20 — End: ?

## 2021-06-21 ENCOUNTER — Telehealth: Payer: Self-pay | Admitting: Medical

## 2021-06-21 NOTE — Telephone Encounter (Signed)
Pt called and is stating that the pharmacy will not fill his norco until tomorrow he wants to know if you will give the ok to fill it early, states he is completely out and is in a lot of pain

## 2021-06-21 NOTE — Telephone Encounter (Signed)
Pt states there where someday's he was taking 2 whole pills a day  and then some days he was just taking one

## 2021-06-22 ENCOUNTER — Other Ambulatory Visit: Payer: Self-pay | Admitting: Medical

## 2021-06-28 ENCOUNTER — Other Ambulatory Visit: Payer: Self-pay | Admitting: Medical

## 2021-06-28 NOTE — Telephone Encounter (Signed)
Is this okay to refill? 

## 2021-07-12 ENCOUNTER — Telehealth: Payer: Self-pay | Admitting: Medical

## 2021-07-12 ENCOUNTER — Other Ambulatory Visit: Payer: Self-pay | Admitting: Medical

## 2021-07-12 MED ORDER — TAMSULOSIN HCL 0.4 MG PO CAPS: 0.4000 mg | ORAL_CAPSULE | Freq: Every day | ORAL | 3 refills | Status: AC

## 2021-07-12 NOTE — Telephone Encounter (Signed)
Vincent Cook is asking for a refill on tamsulosin that has been discontinued.

## 2021-07-17 ENCOUNTER — Other Ambulatory Visit: Payer: Self-pay | Admitting: Medical

## 2021-07-17 ENCOUNTER — Telehealth: Payer: Self-pay

## 2021-07-17 MED ORDER — SILDENAFIL CITRATE 100 MG PO TABS: 100.0000 mg | ORAL_TABLET | ORAL | 0 refills | Status: AC | PRN

## 2021-07-17 NOTE — Telephone Encounter (Signed)
Pt. Called wanting to know if he could get a 3 month supply of his Sildenafil sent in instead of 30 day supply so he doesn't have to call every month. To the [facility] on [address].

## 2021-07-17 NOTE — Telephone Encounter (Signed)
Patient did request this refill, called to verify.

## 2021-07-25 ENCOUNTER — Inpatient Hospital Stay (HOSPITAL_COMMUNITY): Payer: Managed Care, Other (non HMO)

## 2021-07-25 ENCOUNTER — Other Ambulatory Visit: Payer: Self-pay

## 2021-07-25 ENCOUNTER — Inpatient Hospital Stay (HOSPITAL_COMMUNITY)
Admission: EM | Admit: 2021-07-25 | Discharge: 2021-07-28 | DRG: 917 | Disposition: E | Payer: Managed Care, Other (non HMO) | Attending: Internal Medicine | Admitting: Internal Medicine

## 2021-07-25 ENCOUNTER — Emergency Department (HOSPITAL_COMMUNITY): Payer: Managed Care, Other (non HMO)

## 2021-07-25 ENCOUNTER — Encounter (HOSPITAL_COMMUNITY): Payer: Self-pay

## 2021-07-25 DIAGNOSIS — G9382 Brain death: Secondary | ICD-10-CM | POA: Diagnosis present

## 2021-07-25 DIAGNOSIS — Z833 Family history of diabetes mellitus: Secondary | ICD-10-CM

## 2021-07-25 DIAGNOSIS — N529 Male erectile dysfunction, unspecified: Secondary | ICD-10-CM | POA: Diagnosis present

## 2021-07-25 DIAGNOSIS — I4510 Unspecified right bundle-branch block: Secondary | ICD-10-CM | POA: Diagnosis present

## 2021-07-25 DIAGNOSIS — G935 Compression of brain: Secondary | ICD-10-CM | POA: Diagnosis present

## 2021-07-25 DIAGNOSIS — G936 Cerebral edema: Secondary | ICD-10-CM | POA: Diagnosis present

## 2021-07-25 DIAGNOSIS — I469 Cardiac arrest, cause unspecified: Secondary | ICD-10-CM | POA: Diagnosis present

## 2021-07-25 DIAGNOSIS — F17210 Nicotine dependence, cigarettes, uncomplicated: Secondary | ICD-10-CM | POA: Diagnosis present

## 2021-07-25 DIAGNOSIS — N179 Acute kidney failure, unspecified: Secondary | ICD-10-CM | POA: Diagnosis present

## 2021-07-25 DIAGNOSIS — Z66 Do not resuscitate: Secondary | ICD-10-CM | POA: Diagnosis present

## 2021-07-25 DIAGNOSIS — F1490 Cocaine use, unspecified, uncomplicated: Secondary | ICD-10-CM | POA: Diagnosis present

## 2021-07-25 DIAGNOSIS — I959 Hypotension, unspecified: Secondary | ICD-10-CM | POA: Diagnosis present

## 2021-07-25 DIAGNOSIS — J9600 Acute respiratory failure, unspecified whether with hypoxia or hypercapnia: Secondary | ICD-10-CM | POA: Diagnosis present

## 2021-07-25 DIAGNOSIS — T405X1A Poisoning by cocaine, accidental (unintentional), initial encounter: Principal | ICD-10-CM | POA: Diagnosis present

## 2021-07-25 DIAGNOSIS — I468 Cardiac arrest due to other underlying condition: Secondary | ICD-10-CM | POA: Diagnosis present

## 2021-07-25 DIAGNOSIS — R570 Cardiogenic shock: Secondary | ICD-10-CM | POA: Diagnosis present

## 2021-07-25 DIAGNOSIS — Z7189 Other specified counseling: Secondary | ICD-10-CM

## 2021-07-25 DIAGNOSIS — Z515 Encounter for palliative care: Secondary | ICD-10-CM

## 2021-07-25 DIAGNOSIS — I498 Other specified cardiac arrhythmias: Secondary | ICD-10-CM | POA: Diagnosis not present

## 2021-07-25 DIAGNOSIS — Z79899 Other long term (current) drug therapy: Secondary | ICD-10-CM | POA: Diagnosis not present

## 2021-07-25 LAB — POCT I-STAT 7, (LYTES, BLD GAS, ICA,H+H)
Acid-base deficit: 15 mmol/L — ABNORMAL HIGH (ref 0.0–2.0)
Acid-base deficit: 7 mmol/L — ABNORMAL HIGH (ref 0.0–2.0)
Acid-base deficit: 6 mmol/L — ABNORMAL HIGH (ref 0.0–2.0)
Acid-base deficit: 8 mmol/L — ABNORMAL HIGH (ref 0.0–2.0)
Bicarbonate: 13.6 mmol/L — ABNORMAL LOW (ref 20.0–28.0)
Bicarbonate: 17.1 mmol/L — ABNORMAL LOW (ref 20.0–28.0)
Bicarbonate: 18.6 mmol/L — ABNORMAL LOW (ref 20.0–28.0)
Bicarbonate: 23.5 mmol/L (ref 20.0–28.0)
Calcium, Ion: 1.11 mmol/L — ABNORMAL LOW (ref 1.15–1.40)
Calcium, Ion: 1.06 mmol/L — ABNORMAL LOW (ref 1.15–1.40)
Calcium, Ion: 1.06 mmol/L — ABNORMAL LOW (ref 1.15–1.40)
Calcium, Ion: 1.15 mmol/L (ref 1.15–1.40)
HCT: 52 % (ref 39.0–52.0)
HCT: 52 % (ref 39.0–52.0)
HCT: 52 % (ref 39.0–52.0)
HCT: 53 % — ABNORMAL HIGH (ref 39.0–52.0)
Hemoglobin: 17.7 g/dL — ABNORMAL HIGH (ref 13.0–17.0)
Hemoglobin: 17.7 g/dL — ABNORMAL HIGH (ref 13.0–17.0)
Hemoglobin: 17.7 g/dL — ABNORMAL HIGH (ref 13.0–17.0)
Hemoglobin: 18 g/dL — ABNORMAL HIGH (ref 13.0–17.0)
O2 Saturation: 95 %
O2 Saturation: 100 %
O2 Saturation: 100 %
O2 Saturation: 98 %
Patient temperature: 98.7
Patient temperature: 37.2
Patient temperature: 37.3
Potassium: 4.3 mmol/L (ref 3.5–5.1)
Potassium: 2.9 mmol/L — ABNORMAL LOW (ref 3.5–5.1)
Potassium: 2.8 mmol/L — ABNORMAL LOW (ref 3.5–5.1)
Potassium: 2.6 mmol/L — CL (ref 3.5–5.1)
Sodium: 136 mmol/L (ref 135–145)
Sodium: 136 mmol/L (ref 135–145)
Sodium: 135 mmol/L (ref 135–145)
Sodium: 136 mmol/L (ref 135–145)
TCO2: 15 mmol/L — ABNORMAL LOW (ref 22–32)
TCO2: 18 mmol/L — ABNORMAL LOW (ref 22–32)
TCO2: 20 mmol/L — ABNORMAL LOW (ref 22–32)
TCO2: 26 mmol/L (ref 22–32)
pCO2 arterial: 41 mmHg (ref 32–48)
pCO2 arterial: 30.8 mmHg — ABNORMAL LOW (ref 32–48)
pCO2 arterial: 33.9 mmHg (ref 32–48)
pCO2 arterial: 73 mmHg (ref 32–48)
pH, Arterial: 7.128 — CL (ref 7.35–7.45)
pH, Arterial: 7.353 (ref 7.35–7.45)
pH, Arterial: 7.348 — ABNORMAL LOW (ref 7.35–7.45)
pH, Arterial: 7.117 — CL (ref 7.35–7.45)
pO2, Arterial: 98 mmHg (ref 83–108)
pO2, Arterial: 307 mmHg — ABNORMAL HIGH (ref 83–108)
pO2, Arterial: 536 mmHg — ABNORMAL HIGH (ref 83–108)
pO2, Arterial: 150 mmHg — ABNORMAL HIGH (ref 83–108)

## 2021-07-25 LAB — COMPREHENSIVE METABOLIC PANEL
ALT: 793 U/L — ABNORMAL HIGH (ref 0–44)
AST: 857 U/L — ABNORMAL HIGH (ref 15–41)
Albumin: 3 g/dL — ABNORMAL LOW (ref 3.5–5.0)
Alkaline Phosphatase: 68 U/L (ref 38–126)
Anion gap: 30 — ABNORMAL HIGH (ref 5–15)
BUN: 12 mg/dL (ref 6–20)
CO2: 12 mmol/L — ABNORMAL LOW (ref 22–32)
Calcium: 8.9 mg/dL (ref 8.9–10.3)
Chloride: 100 mmol/L (ref 98–111)
Creatinine, Ser: 1.73 mg/dL — ABNORMAL HIGH (ref 0.61–1.24)
GFR, Estimated: 45 mL/min — ABNORMAL LOW (ref >60)
Glucose, Bld: 215 mg/dL — ABNORMAL HIGH (ref 70–99)
Potassium: 4.4 mmol/L (ref 3.5–5.1)
Sodium: 142 mmol/L (ref 135–145)
Total Bilirubin: 1 mg/dL (ref 0.3–1.2)
Total Protein: 5.9 g/dL — ABNORMAL LOW (ref 6.5–8.1)

## 2021-07-25 LAB — CBC WITH DIFFERENTIAL/PLATELET
Abs Immature Granulocytes: 0.95 10*3/uL — ABNORMAL HIGH (ref 0.00–0.07)
Basophils Absolute: 0.1 10*3/uL (ref 0.0–0.1)
Basophils Relative: 0 %
Eosinophils Absolute: 0.1 10*3/uL (ref 0.0–0.5)
Eosinophils Relative: 1 %
HCT: 47 % (ref 39.0–52.0)
Hemoglobin: 14.6 g/dL (ref 13.0–17.0)
Immature Granulocytes: 6 %
Lymphocytes Relative: 32 %
Lymphs Abs: 5.5 10*3/uL — ABNORMAL HIGH (ref 0.7–4.0)
MCH: 32.7 pg (ref 26.0–34.0)
MCHC: 31.1 g/dL (ref 30.0–36.0)
MCV: 105.4 fL — ABNORMAL HIGH (ref 80.0–100.0)
Monocytes Absolute: 0.4 10*3/uL (ref 0.1–1.0)
Monocytes Relative: 2 %
Neutro Abs: 10.3 10*3/uL — ABNORMAL HIGH (ref 1.7–7.7)
Neutrophils Relative %: 59 %
Platelets: 166 10*3/uL (ref 150–400)
RBC: 4.46 MIL/uL (ref 4.22–5.81)
RDW: 12.2 % (ref 11.5–15.5)
WBC: 17.3 10*3/uL — ABNORMAL HIGH (ref 4.0–10.5)
nRBC: 0.3 % — ABNORMAL HIGH (ref 0.0–0.2)

## 2021-07-25 LAB — ECHOCARDIOGRAM LIMITED
Calc EF: 47.1 %
Height: 72 in
S' Lateral: 3.2 cm
Single Plane A2C EF: 52 %
Single Plane A4C EF: 44.3 %
Weight: 3200 oz

## 2021-07-25 LAB — RAPID URINE DRUG SCREEN, HOSP PERFORMED
Amphetamines: POSITIVE — AB
Barbiturates: NOT DETECTED
Benzodiazepines: NOT DETECTED
Cocaine: POSITIVE — AB
Opiates: NOT DETECTED
Tetrahydrocannabinol: NOT DETECTED

## 2021-07-25 LAB — BASIC METABOLIC PANEL
Anion gap: 20 — ABNORMAL HIGH (ref 5–15)
BUN: 21 mg/dL — ABNORMAL HIGH (ref 6–20)
CO2: 18 mmol/L — ABNORMAL LOW (ref 22–32)
Calcium: 8.3 mg/dL — ABNORMAL LOW (ref 8.9–10.3)
Chloride: 100 mmol/L (ref 98–111)
Creatinine, Ser: 2.84 mg/dL — ABNORMAL HIGH (ref 0.61–1.24)
GFR, Estimated: 25 mL/min — ABNORMAL LOW (ref >60)
Glucose, Bld: 187 mg/dL — ABNORMAL HIGH (ref 70–99)
Potassium: 3 mmol/L — ABNORMAL LOW (ref 3.5–5.1)
Sodium: 138 mmol/L (ref 135–145)

## 2021-07-25 LAB — LACTIC ACID, PLASMA
Lactic Acid, Venous: >9 mmol/L (ref 0.5–1.9)
Lactic Acid, Venous: >9 mmol/L (ref 0.5–1.9)
Lactic Acid, Venous: 9 mmol/L (ref 0.5–1.9)

## 2021-07-25 LAB — TROPONIN I (HIGH SENSITIVITY)
Troponin I (High Sensitivity): 866 ng/L (ref <18)
Troponin I (High Sensitivity): 14162 ng/L (ref <18)

## 2021-07-25 MED ORDER — ALBUMIN HUMAN 5 % IV SOLN
25.0000 g | Freq: Once | INTRAVENOUS | Status: AC
  Administered 2021-07-25: 25 g via INTRAVENOUS
  Filled 2021-07-25: qty 500

## 2021-07-25 MED ORDER — PANTOPRAZOLE SODIUM 40 MG IV SOLR: 40.0000 mg | Freq: Every day | INTRAVENOUS | Status: DC

## 2021-07-25 MED ORDER — NOREPINEPHRINE 4 MG/250ML-% IV SOLN
0.0000 ug/min | INTRAVENOUS | Status: DC
  Administered 2021-07-25: 10 ug/min via INTRAVENOUS

## 2021-07-25 MED ORDER — PANTOPRAZOLE 2 MG/ML SUSPENSION: 40.0000 mg | Freq: Every day | ORAL | Status: DC

## 2021-07-25 MED ORDER — PERFLUTREN LIPID MICROSPHERE
1.0000 mL | INTRAVENOUS | Status: AC | PRN
  Administered 2021-07-25: 2 mL via INTRAVENOUS

## 2021-07-25 MED ORDER — AMIODARONE IV BOLUS ONLY 150 MG/100ML
150.0000 mg | Freq: Once | INTRAVENOUS | Status: AC
Start: 2021-07-25 — End: 2021-07-25
  Administered 2021-07-25: 150 mg via INTRAVENOUS

## 2021-07-25 MED ORDER — ORAL CARE MOUTH RINSE
15.0000 mL | OROMUCOSAL | Status: DC
  Administered 2021-07-25 (×4): 15 mL via OROMUCOSAL

## 2021-07-25 MED ORDER — PROPOFOL 1000 MG/100ML IV EMUL
5.0000 ug/kg/min | INTRAVENOUS | Status: DC
  Administered 2021-07-25: 5 ug/kg/min via INTRAVENOUS
  Filled 2021-07-25 (×2): qty 100

## 2021-07-25 MED ORDER — SODIUM BICARBONATE 8.4 % IV SOLN
INTRAVENOUS | Status: DC
  Filled 2021-07-25 (×2): qty 1000

## 2021-07-25 MED ORDER — CHLORHEXIDINE GLUCONATE CLOTH 2 % EX PADS
6.0000 | MEDICATED_PAD | Freq: Every day | CUTANEOUS | Status: DC
  Administered 2021-07-25: 6 via TOPICAL

## 2021-07-25 MED ORDER — ORAL CARE MOUTH RINSE: 15.0000 mL | OROMUCOSAL | Status: DC | PRN

## 2021-07-25 MED ORDER — NOREPINEPHRINE 4 MG/250ML-% IV SOLN
0.0000 ug/min | INTRAVENOUS | Status: DC
  Administered 2021-07-25: 50 ug/min via INTRAVENOUS
  Filled 2021-07-25: qty 250

## 2021-07-25 MED ORDER — NOREPINEPHRINE 4 MG/250ML-% IV SOLN
0.0000 ug/min | INTRAVENOUS | Status: DC
  Administered 2021-07-25 (×4): 20 ug/min via INTRAVENOUS
  Filled 2021-07-25 (×5): qty 250

## 2021-07-25 MED ORDER — EPINEPHRINE HCL 5 MG/250ML IV SOLN IN NS
0.5000 ug/min | INTRAVENOUS | Status: DC
  Administered 2021-07-25: 5 ug/min via INTRAVENOUS

## 2021-07-25 MED ORDER — EPINEPHRINE HCL 5 MG/250ML IV SOLN IN NS
0.5000 ug/min | INTRAVENOUS | Status: DC
  Administered 2021-07-25: 5 ug/min via INTRAVENOUS
  Filled 2021-07-25: qty 250

## 2021-07-25 MED ORDER — SODIUM CHLORIDE 0.9 % IV SOLN
2.0000 g | INTRAVENOUS | Status: DC
  Administered 2021-07-25: 2 g via INTRAVENOUS
  Filled 2021-07-25: qty 20

## 2021-07-25 MED ORDER — EPINEPHRINE HCL 5 MG/250ML IV SOLN IN NS
0.5000 ug/min | INTRAVENOUS | Status: DC
  Administered 2021-07-25: 10 ug/min via INTRAVENOUS
  Filled 2021-07-25 (×2): qty 250

## 2021-07-28 NOTE — Progress Notes (Signed)
eLink Physician-Brief Progress Note Patient Name: Lytle Malburg DOB: [birth year] MRN: [id number]   Date of Service  August 07, 2021  HPI/Events of Note  Family is now ready to withdraw care, I went in the room and verified from them thay they are ready to proceed with compassionate extubation and comfort measures.  eICU Interventions  Orders entered for compassionate extubation and comfort measures.        Migdalia Dk 08/07/2021, 11:37 PM

## 2021-07-28 NOTE — Consult Note (Signed)
Consultation Note Date: 08/01/2021   Patient Name: Vincent Cook  DOB: 06-26-1965  MRN: [id number]  Age / Sex: 56 y.o., male  PCP: Glade Lloyd Camelia Eng, PA-C Referring Physician: Candee Furbish, MD  Reason for Consultation: Establishing goals of care  HPI/Patient Profile: 56 y.o. male  with past medical history of substance abuse admitted on 08-01-2021 after found pulseless with unknown downtime and then received CPR > 30 min before ROSC. Toxicology + for amphetamines and + cocaine. CT head already shows significant injury with concern for early herniation. RN reports that he is breathing over ventilator when sedation decreased so not full brain death at this stage.   Clinical Assessment and Goals of Care: I met with multiple family members including 3 daughters, mother, sister, amongst others along with Dr. Darrick Meigs. I had previously reviewed records and diagnostics and discussed with bedside RN. Dr. Darrick Meigs and I explained the significant injury to [name] brain and overall poor prognosis. We explained that he is not expected to survive this illness much longer. Family are very shocked with this news. We gently provided explained that Dayvian will not recover from this injury and the interventions we are providing at this stage is prolonging his suffering. We discussed with them the option of transition to comfort care and liberalizing from ventilator support. Family are very quiet and still struggling to process this information. Sister shares that they are not prepared to liberalize him from ventilator at this time. She reports that they would like for him to die naturally. With this said I discussed with them about continuing current care for now, allowing family time to visit and say goodbye, but I also recommend that as he progresses towards end of life we do not continue to intervene to add more life  prolonging measures. Family gave no response. I left them to process all this information and privacy to support each other.   All questions/concerns addressed. Emotional support provided.   Primary Decision Maker NEXT OF KIN mother and adult daughters (3 daughters)    SUMMARY OF RECOMMENDATIONS   - DNR already in place - Family still in shock and not prepared to make any further decisions at this time  Code Status/Advance Care Planning: DNR   Symptom Management:  Per PCCM.   Prognosis:  Hours - Days  Discharge Planning: Anticipated Hospital Death      Primary Diagnoses: Present on Admission:  Cardiac arrest [facility])   I have reviewed the medical record, interviewed the patient and family, and examined the patient. The following aspects are pertinent.  Past Medical History:  Diagnosis Date   Eczema    Erectile dysfunction 2018   Pneumonia 2008   [place], 3 day hospitalization   Social History   Socioeconomic History   Marital status: Single    Spouse name: Not on file   Number of children: Not on file   Years of education: Not on file   Highest education level: Not on file  Occupational History   Not on file  Tobacco Use   Smoking status: Some Days    Packs/day: 1.00    Years: 8.00    Total pack years: 8.00    Types: Cigarettes    Last attempt to quit: 02/25/2016    Years since quitting: 5.4   Smokeless tobacco: Never  Vaping Use   Vaping Use: Never used  Substance and Sexual Activity   Alcohol use: Yes    Alcohol/week: 12.0 standard drinks of alcohol    Types: 12 Cans of beer per week   Drug use: Not Currently    Types: Cocaine   Sexual activity: Not on file  Other Topics Concern   Not on file  Social History Narrative   In relatinoship, single.  4 children.  Working at [employer].   Psychologist, occupational.    Walks a lot, walks about 5 miles per night.   06/2018.      Social Determinants of Health   Financial Resource Strain: Not  on file  Food Insecurity: Not on file  Transportation Needs: Not on file  Physical Activity: Not on file  Stress: Not on file  Social Connections: Not on file   Family History  Problem Relation Age of Onset   Diabetes Mother    Other Father        natural causes   Diabetes Brother    Cancer Maternal Uncle        prostate   Heart disease Neg Hx    Hypertension Neg Hx    Stroke Neg Hx    Scheduled Meds:  Chlorhexidine Gluconate Cloth  6 each Topical Daily   mouth rinse  15 mL Mouth Rinse Q2H   pantoprazole (PROTONIX) IV  40 mg Intravenous QHS   Continuous Infusions:  cefTRIAXone (ROCEPHIN)  IV Stopped (2021-08-04 1116)   epinephrine 5 mcg/min (August 04, 2021 1400)   norepinephrine (LEVOPHED) Adult infusion 20 mcg/min (04-Aug-2021 1400)   propofol (DIPRIVAN) infusion 25 mcg/kg/min (2021-08-04 1400)   sodium bicarbonate 150 mEq in dextrose 5 % 1,150 mL infusion 100 mL/hr at 04-Aug-2021 1400   PRN Meds:.mouth rinse, perflutren lipid microspheres (DEFINITY) IV suspension No Known Allergies Review of Systems  Unable to perform ROS: Acuity of condition    Physical Exam Vitals and nursing note reviewed.  Constitutional:      General: He is not in acute distress.    Appearance: He is ill-appearing.     Interventions: He is intubated.  Cardiovascular:     Rate and Rhythm: Tachycardia present.  Pulmonary:     Effort: He is intubated.     Comments: Breathing over vent at this time Abdominal:     General: Abdomen is flat.  Neurological:     Mental Status: He is unresponsive.     Vital Signs: BP 118/80   Pulse (!) 112   Temp 97.7 F (36.5 C)   Resp (!) 27   Ht 6' (1.829 m)   Wt 90.7 kg   SpO2 100%   BMI 27.12 kg/m  Pain Scale: CPOT       SpO2: SpO2: 100 % O2 Device:SpO2: 100 % O2 Flow Rate: .   IO: Intake/output summary:  Intake/Output Summary (Last 24 hours) at August 04, 2021 1439 Last data filed at Aug 04, 2021 1400 Gross per 24 hour  Intake 1232.29 ml  Output --  Net  1232.29 ml    LBM: Last BM Date :  (PTA) Baseline Weight: Weight: 90.7 kg Most recent weight: Weight: 90.7 kg     Palliative Assessment/Data:  Time Total: 60 min  Greater than 50%  of this time was spent counseling and coordinating care related to the above assessment and plan.  Signed by: Vinie Sill, NP Palliative Medicine Team Pager # [phone] (M-F 8a-5p) Team Phone # [phone] (Nights/Weekends)

## 2021-07-28 NOTE — ED Notes (Signed)
Patient BP spiked to 202/143 cut all drips off and transported to ICU

## 2021-07-28 NOTE — ED Notes (Signed)
Manual BP only heard at 50 - Levo titrated to 15

## 2021-07-28 NOTE — ED Notes (Addendum)
NG OG attempted multiple times - unsuccessful

## 2021-07-28 NOTE — ED Notes (Signed)
Levo started at 10 by [place] RN

## 2021-07-28 NOTE — Procedures (Signed)
Patient Name: Vincent Cook  MRN: [id number]  Epilepsy Attending: Charlsie Quest  Referring Physician/Provider: Elige Radon, MD  Date: August 21, 2021 Duration: 22.15 mins  Patient history: 56 year old man who did cocaine last night and was found pulseless.  > 30 mins CPR. EEG to evaluate for seizure.  Level of alertness: comatose  AEDs during EEG study: Propofol  Technical aspects: This EEG study was done with scalp electrodes positioned according to the 10-20 International system of electrode placement. Electrical activity was acquired at a sampling rate of 500Hz  and reviewed with a high frequency filter of 70Hz  and a low frequency filter of 1Hz . EEG data were recorded continuously and digitally stored.   Description: EEG showed continuous generalized background suppression, not reactive to tactile stimulation. Hyperventilation and photic stimulation were not performed.     ABNORMALITY -Background suppression, generalized  IMPRESSION: This study is suggestive of profound diffuse encephalopathy, nonspecific etiology but likely related to sedation,anoxic/hypoxic brain injury. No seizures or epileptiform discharges were seen throughout the recording.  Dr was notified.  Vincent Cook 

## 2021-07-28 NOTE — ED Notes (Signed)
PA Allyne Gee and MD Horton at bedside -  pt to be intubated -

## 2021-07-28 NOTE — ED Notes (Signed)
Levo titrated up to 20

## 2021-07-28 NOTE — ED Provider Notes (Signed)
[facility] EMERGENCY DEPARTMENT Provider Note   CSN: [id number] Arrival date & time: 08/10/2021  1829     History  Chief Complaint  Patient presents with   Post CPR    Yancy Pavlik is a 56 y.o. male.  HPI     This is a 56 year old male who presents following cardiac arrest.  Per EMS, patient was found down this morning by his significant other.  Significant other reported that they had done cocaine into the early morning hours.  He fell asleep approximately 2 hours prior to being found.  He was found down.  Per EMS he was asystole and then PEA.  He received approximately 30 minutes of CPR prior to arrival.  They did have ROSC.  He lost pulses in route again and they were able to get ROSC just prior to arrival.  He has a King airway in place.  Level 5 caveat  Home Medications Prior to Admission medications   Medication Sig Start Date End Date Taking? Authorizing Provider  diazepam (VALIUM) 10 MG tablet Take 1 tablet (10 mg total) by mouth every 12 (twelve) hours as needed for anxiety. Caution - sedation 06/08/21   Tysinger, Kermit Balo, PA-C  HYDROcodone-acetaminophen [facility]) 10-325 MG tablet Take 0.5 tablets by mouth 2 (two) times daily as needed. Caution - sedation 06/20/21   Tysinger, Kermit Balo, PA-C  predniSONE (DELTASONE) 10 MG tablet 6 tablets all together day 1, 5 tablets day 2, 4 tablets day 3, 3 tablets day 4, 2 tablets day 5, 1 tablet day 6. 06/08/21   Tysinger, Kermit Balo, PA-C  sildenafil (VIAGRA) 100 MG tablet Take 1 tablet (100 mg total) by mouth as needed for erectile dysfunction. 07/17/21   Tysinger, Kermit Balo, PA-C  tamsulosin (FLOMAX) 0.4 MG CAPS capsule Take 1 capsule (0.4 mg total) by mouth daily. 07/12/21   Tysinger, Kermit Balo, PA-C      Allergies    Patient has no known allergies.    Review of Systems   Review of Systems  Unable to perform ROS: Acuity of condition    Physical Exam Updated Vital Signs BP (!) 154/133   Pulse 92   Temp (!) 95.8 F  (35.4 C) (Temporal)   Resp 20   SpO2 90%  Physical Exam Vitals and nursing note reviewed.  Constitutional:      Appearance: He is well-developed.     Comments: Obtunded, GCS 3  HENT:     Head: Normocephalic and atraumatic.  Eyes:     Comments: 6 and nonreactive  Cardiovascular:     Rate and Rhythm: Rhythm irregular.     Heart sounds: Normal heart sounds. No murmur heard. Pulmonary:     Effort: Pulmonary effort is normal. No respiratory distress.     Breath sounds: Wheezing present.     Comments: Tight Abdominal:     Palpations: Abdomen is soft.     Tenderness: There is no abdominal tenderness. There is no rebound.  Musculoskeletal:     Cervical back: Neck supple.  Skin:    General: Skin is warm and dry.  Neurological:     Comments: Not withdrawing to pain, no gag reflex  Psychiatric:     Comments: Unable to assess     ED Results / Procedures / Treatments   Labs (all labs ordered are listed, but only abnormal results are displayed) Labs Reviewed  CBC WITH DIFFERENTIAL/PLATELET  COMPREHENSIVE METABOLIC PANEL  LACTIC ACID, PLASMA  LACTIC ACID, PLASMA  RAPID URINE  DRUG SCREEN, HOSP PERFORMED  I-STAT ARTERIAL BLOOD GAS, ED  TROPONIN I (HIGH SENSITIVITY)    EKG EKG Interpretation  Date/Time:  Wednesday July 25 2021 06:52:20 EDT Ventricular Rate:  118 PR Interval:  194 QRS Duration: 151 QT Interval:  383 QTC Calculation: 509 R Axis:   107 Text Interpretation: Sinus tachycardia with irregular rate Right bundle branch block ST depr, consider ischemia, anterolateral lds Baseline wander in lead(s) I II aVR Confirmed by Ross Marcus ([id number]) on 07/24/2021 7:05:01 AM  Radiology No results found.  Procedures .Critical Care  Performed by: Shon Baton, MD Authorized by: Shon Baton, MD   Critical care provider statement:    Critical care time (minutes):  45   Critical care was necessary to treat or prevent imminent or life-threatening deterioration  of the following conditions:  Cardiac failure   Critical care was time spent personally by me on the following activities:  Development of treatment plan with patient or surrogate, discussions with consultants, evaluation of patient's response to treatment, examination of patient, ordering and review of laboratory studies, ordering and review of radiographic studies, ordering and performing treatments and interventions, pulse oximetry, re-evaluation of patient's condition and review of old charts     Medications Ordered in ED Medications - No data to display  ED Course/ Medical Decision Making/ A&P                           Medical Decision Making Amount and/or Complexity of Data Reviewed Labs: ordered. Radiology: ordered.  Risk Decision regarding hospitalization.   This patient presents to the ED for concern of cardiac arrest, this involves an extensive number of treatment options, and is a complaint that carries with it a high risk of complications and morbidity.  I considered the following differential and admission for this acute, potentially life threatening condition.  The differential diagnosis includes primary cardiac event arrhythmia, respiratory arrest, drug use  MDM:    This is a 56 year old male who presents in cardiac arrest.  He had ROSC upon arrival.  Hypotensive.  He has significant ectopy on the monitor.  Code STEMI was initially called but was canceled given repeat EKG here.  He was intubated without drugs.  He has a poor neurologic exam.  Levophed and 2 A of bicarb were given upon his arrival.  Lab work is pending.  Family was updated.  Patient will be admitted to the critical care service.  Suspect that he may have a poor prognosis given current neurologic exam.  No indication for cooling at this time given unwitnessed, non-V-fib arrest.  (Labs, imaging, consults)  Labs: I Ordered, and personally interpreted labs.  The pertinent results include: Pending  Imaging  Studies ordered: I ordered imaging studies including pending I independently visualized and interpreted imaging. I agree with the radiologist interpretation  Additional history obtained from family and EMS.  External records from outside source obtained and reviewed including prior evaluations  Cardiac Monitoring: The patient was maintained on a cardiac monitor.  I personally viewed and interpreted the cardiac monitored which showed an underlying rhythm of: Sinus arrhythmia  Reevaluation: After the interventions noted above, I reevaluated the patient and found that they have : Poor prognosis  Social Determinants of Health: Drug use  Disposition: Admit  Co morbidities that complicate the patient evaluation  Past Medical History:  Diagnosis Date   Eczema    Erectile dysfunction 2018   Pneumonia 2008   [facility]  [place], 3 day hospitalization     Medicines No orders of the defined types were placed in this encounter.   I have reviewed the patients home medicines and have made adjustments as needed  Problem List / ED Course: Problem List Items Addressed This Visit   None Visit Diagnoses     Cardiac arrest [facility])    -  Primary                   Final Clinical Impression(s) / ED Diagnoses Final diagnoses:  Cardiac arrest [facility])    Rx / DC Orders ED Discharge Orders     None         Merryl Hacker, MD 08/10/2021 [phone]

## 2021-07-28 NOTE — ED Notes (Signed)
Cards at bedside - STEMI can be cancelled

## 2021-07-28 NOTE — ED Notes (Signed)
Titrating drips per Dr Christians verbal orders.

## 2021-07-28 NOTE — Progress Notes (Signed)
Critical ABG results given to Dr Katrinka Blazing. Orders to increase VT to pts 8cc's and RR to 28.

## 2021-07-28 NOTE — Progress Notes (Signed)
  Transition of Care [facility]) Screening Note   Patient Details  Name: Vincent Cook Date of Birth: 1965/07/02   Transition of Care [facility]) CM/SW Contact:    Harriet Masson, RN Phone Number: 07/17/2021, 9:24 AM    Transition of Care Department [facility]) has reviewed patient and no TOC needs have been identified at this time. We will continue to monitor patient advancement through interdisciplinary progression rounds. If new patient transition needs arise, please place a TOC consult.

## 2021-07-28 NOTE — ED Notes (Signed)
Family has decided to make patient a DNR, drips titrateed back per Dr Ephriam Knuckles and Dr Katrinka Blazing to the rate they would like.  Attempted with PA and MD for another insertion of OG unsuccessful.  Dr Ephriam Knuckles cancelled abg.

## 2021-07-28 NOTE — ED Notes (Signed)
Pt intubated - 7.5 and 23 @ lip Positive color change and bilat breath sounds heard

## 2021-07-28 NOTE — ED Triage Notes (Signed)
Pt BIB EMS from home. Per EMS pt did cocaine last night and went to sleep around 1.5hr ago . Pt found unresponsive and pulseless. of CPR, 7 EPIs - EMS got pulses for and lost again. ROSC achieved again prior to arrival. EMS also gave 500 of fluid and .4 narcan Pt was in PEA and asystole with EMS.  Bilat 20s

## 2021-07-28 NOTE — Progress Notes (Signed)
RT changed vent due to O2 sensor alarm without complications. Family at bedside.

## 2021-07-28 NOTE — Procedures (Signed)
Adult Brain Death Determination  Time of Examination: 17-Aug-2021 6:29 PM  No Evidence of /Cause of Reversible CNS Depression  Core temperature must be greater >36 degrees. Last temp: Temp: 99.3 F (37.4 C) (Note: If unable to achieve normothermia after 12 hours of temperature management, may consider proceeding with Brain Death Evaluation.):    yes  Evidence of severe metabolic perturbations that could potentate CNS depression. Consider glucose, Na, creatinine, PaCO2, SaO2.:    Absent  Evidence of drugs, by history or measurement, that could potentiate central nervous system depression: narcotics, ethanol, benzodiazepines, barbiturates, neuromuscular blockade.:     Absent  Absence of Cortical Function  GCS = 3:    yes  Absence of Brain Stem Reflexes and Responses  Pupils light-fixed    yes  Corneal reflexes:    Absent  Response to upper and lower airway stimulation, such as pharyngeal and endotracheal suctioning.:    Absent  Ocular response to head turning (eye movement).    Absent  Absence of Spontaneous Respirations  (Apnea test performed per Brain Death Policy. If not met due to hemodynamic/ventilatory instability, then perform EEG, TCD, or cerebral blow flow studies.)  1.   Spontaneous Respirations   Absent  2.   PaCO2 at start of apnea test:  34  3.   PaCO2 at end of apnea test:  73  4.   CO2 rise of 20 or greater from baseline:   yes  Document Confirmatory Test Utilized: (Optional) Nuclear cerebral flow, cerebral angiography (CT/MR angio), transcranial Doppler ultrasound, EEG, SSEP (record results).  Test results (if available):  CT head with devastating neurological injury  Patient pronounced dead by neurological criteria at 18:30 on Aug 17, 2021.  Candee Furbish, MD 17-Aug-2021 6:29 PM

## 2021-07-28 NOTE — ED Notes (Signed)
Comfort care started for patient per Dr Katrinka Blazing.  Informed of lactic >9 relayed to Dr Katrinka Blazing. Verbal order for no meds or blood work at this time other than drips patient is currently on.

## 2021-07-28 NOTE — Progress Notes (Addendum)
8 minute apnea test performed RT x's 2, MD and RN at bedside. ABG obtained after 8 minutes and pt was placed back on full vent support per protocol.

## 2021-07-28 NOTE — Progress Notes (Signed)
Full note to follow.  Prolonged OOH arrest with unknown downtime.  ROSC obtained but continued worsening blood pressure with maximizing pressor needs.  Family brought to bedside and are saying their goodbyes.  Will bring to ICU to allow them more time to grieve.  DNR, comfort measures, skip head CT.  In hospital death expected.  Myrla Halsted MD PCCM

## 2021-07-28 NOTE — Progress Notes (Signed)
IC note  Cath lab activated to due cardiac arrest; EKG in field with RBBB.  Patient found down, unknown down time, ROSC after 30 min CPR.  On arrival in ED, repeat EKG without STEs.  Will defer cath lab for now.  Discussed with ED staff who are in agreement.

## 2021-07-28 NOTE — Progress Notes (Signed)
Chaplain responded to Code Stemi. Attended physician as family heard of dire diagnosis. Chaplain offered ministry of presence with pt's mother and other family members present.   Vernell Morgans Chaplain

## 2021-07-28 NOTE — ED Notes (Addendum)
Amio bolus completed - no cont gtt needed at this time

## 2021-07-28 NOTE — Progress Notes (Signed)
  Echocardiogram 2D Echocardiogram has been performed.  Vincent Cook 07/14/2021, 2:40 PM

## 2021-07-28 NOTE — Progress Notes (Signed)
2 D echo attempted, but EEG in room. Will try later °

## 2021-07-28 NOTE — Progress Notes (Signed)
Pt's sister Whitney Muse at bedside with her husband. Ventilator discontinued at this time.   Time of cardiac death 55 Verified by RN Gevena Barre.

## 2021-07-28 NOTE — ED Notes (Signed)
150 amio bolus started

## 2021-07-28 NOTE — ED Notes (Signed)
2nd amp bicarb

## 2021-07-28 NOTE — ED Provider Notes (Signed)
  INTUBATION Performed by: Garlon Hatchet  Required items: required blood products, implants, devices, and special equipment available Patient identity confirmed: provided demographic data and hospital-assigned identification number Time out: Immediately prior to procedure a "time out" was called to verify the correct patient, procedure, equipment, support staff and site/side marked as required.  Indications: airway protection  Intubation method: Glidescope Laryngoscopy   Preoxygenation: BVM  Sedatives: none Paralytic: none  Tube Size: 7.5 cuffed  Post-procedure assessment: chest rise and ETCO2 monitor Breath sounds: equal and absent over the epigastrium Tube secured with: ETT holder Chest x-ray interpreted by radiologist and me.  Chest x-ray findings: endotracheal tube shallow, advanced a 3cm.  Patient tolerated the procedure well with no immediate complications.     Garlon Hatchet, PA-C 07/23/2021 0037    Shon Baton, MD 07/31/21 [phone]

## 2021-07-28 NOTE — ED Notes (Signed)
1st amp of bicarb

## 2021-07-28 NOTE — Progress Notes (Addendum)
Time of death 18:30. Will update family. 24h hospital guidelines do not apply with CT and clinical history.  Family updated.  Will give until morning to allow grieving before withdrawing support.  Myrla Halsted MD PCCM

## 2021-07-28 NOTE — Progress Notes (Signed)
eLink Physician-Brief Progress Note Patient Name: Vincent Cook DOB: 13-Nov-1965 MRN: [id number]   Date of Service  08/13/2021  HPI/Events of Note  Patient with out of hospital cardiac arrest in the context of cocaine use, he was comatose and required intubation on admission, he now meets criteria for clinical and EEG brain death, he is extremely hypotensive despite Levophed and Epinephrine gtt, family is in the process of deciding on organ donation.  eICU Interventions  Albumin 5 % 500 ml iv bolus ordered, and ceiling on pressors raised to by some time for family to decide whether to proceed with organ donation.        Migdalia Dk 2021/08/13, 7:56 PM

## 2021-07-28 NOTE — Progress Notes (Signed)
EEG flat Exam progressed. Will perform formal brain death testing. Family updated.  Myrla Halsted MD PCCM

## 2021-07-28 NOTE — Progress Notes (Signed)
Notified by Howard Pouch Patient Care coordinator that family would not like to pursue donation.   2211 Family notified this RN that they are ready to withdraw care at this time. Pt is going to be a Medical Examiner's case per [place]demar [place], [place].

## 2021-07-28 NOTE — H&P (Signed)
NAME:  Vincent Cook, MRN:  [id number], DOB:  09/24/1965, LOS: 0 ADMISSION DATE:  2021-08-07, CONSULTATION DATE:  07/24/2021 REFERRING MD:  EDP, CHIEF COMPLAINT:  [facility]   History of Present Illness:  56 year old man who did cocaine last night and was found pulseless.  > 30 mins CPR.  ROSC obtained in ER and patient intubated by EDP.  Unable to place OGT despite multiple atempts.  Ongoing severe shock state.  PCCM consulted for admission.  Hx per chart review.  Pertinent  Medical History  Eczema  Significant Hospital Events: Including procedures, antibiotic start and stop dates in addition to other pertinent events   6/28 admitted  Interim History / Subjective:  consulted  Objective   Blood pressure (!) 74/55, pulse 97, temperature (!) 94.1 F (34.5 C), resp. rate 20, height 6' (1.829 m), weight 90.7 kg, SpO2 98 %.    Vent Mode: PRVC FiO2 (%):  [100 %] 100 % Set Rate:  [18 bmp] 18 bmp Vt Set:  [550 mL-620 mL] 620 mL PEEP:  [10 cmH20] 10 cmH20 Plateau Pressure:  [17 cmH20-19 cmH20] 19 cmH20   Intake/Output Summary (Last 24 hours) at 2021/08/07 0900 Last data filed at 08-07-21 3710 Gross per 24 hour  Intake 145.46 ml  Output --  Net 145.46 ml   Filed Weights   Aug 07, 2021 0738  Weight: 90.7 kg    Examination: Comatose man on vent Passive, GCS 3, no brainstem reflexes Rhonci bilaterally Ext warm  Lactate > 9 Acute kidney/liver injury noted ETT high on CXR  Resolved Hospital Problem list   N/A  Assessment & Plan:  Prolonged OOH cardiac arrest in context of cocaine use with multiorgan failure (resp, neuro, GI, heart etc) portending dismal prognosis.  - Continue vent support - Family at bedside, allowing time to grieve - Further pressor escalation futile in current clinical scenario - Should he survive next hour or two we can re-address how far to go with family - Will ask palliative to see if they have time today  Best Practice (right click and "Reselect all  SmartList Selections" daily)   Diet/type: NPO DVT prophylaxis: not indicated GI prophylaxis: PPI Lines: N/A Foley:  Yes, and it is still needed Code Status:  DNR Last date of multidisciplinary goals of care discussion [Ongoing]  Labs   CBC: Recent Labs  Lab 08-07-21 0701  WBC 17.3*  NEUTROABS 10.3*  HGB 14.6  HCT 47.0  MCV 105.4*  PLT 166    Basic Metabolic Panel: Recent Labs  Lab Aug 07, 2021 0701  NA 142  K 4.4  CL 100  CO2 12*  GLUCOSE 215*  BUN 12  CREATININE 1.73*  CALCIUM 8.9   GFR: Estimated Creatinine Clearance: 51.7 mL/min (A) (by C-G formula based on SCr of 1.73 mg/dL (H)). Recent Labs  Lab 2021/08/07 0701  WBC 17.3*  LATICACIDVEN >9.0*    Liver Function Tests: Recent Labs  Lab 08-07-21 0701  AST 857*  ALT 793*  ALKPHOS 68  BILITOT 1.0  PROT 5.9*  ALBUMIN 3.0*   No results for input(s): "LIPASE", "AMYLASE" in the last 168 hours. No results for input(s): "AMMONIA" in the last 168 hours.  ABG No results found for: "PHART", "PCO2ART", "PO2ART", "HCO3", "TCO2", "ACIDBASEDEF", "O2SAT"   Coagulation Profile: No results for input(s): "INR", "PROTIME" in the last 168 hours.  Cardiac Enzymes: No results for input(s): "CKTOTAL", "CKMB", "CKMBINDEX", "TROPONINI" in the last 168 hours.  HbA1C: Hgb A1c MFr Bld  Date/Time Value Ref Range Status  02/20/2021 02:00 PM 5.5 4.8 - 5.6 % Final    Comment:             Prediabetes: 5.7 - 6.4          Diabetes: >6.4          Glycemic control for adults with diabetes: <7.0   07/01/2013 09:39 AM 5.2 <5.7 % Final    Comment:                                                                           According to the ADA Clinical Practice Recommendations for 2011, when HbA1c is used as a screening test:     >=6.5%   Diagnostic of Diabetes Mellitus            (if abnormal result is confirmed)   5.7-6.4%   Increased risk of developing Diabetes Mellitus   References:Diagnosis and Classification of Diabetes  [place],Diabetes [id number] 1):S62-S69 and Standards of Medical Care in         Diabetes - 2011,Diabetes [id number] (Suppl 1):S11-S61.      CBG: No results for input(s): "GLUCAP" in the last 168 hours.  Review of Systems:   Comatose  Past Medical History:  He,  has a past medical history of Eczema, Erectile dysfunction (2018), and Pneumonia (2008).   Surgical History:   Past Surgical History:  Procedure Laterality Date   KNEE SURGERY     left, ligament repair, Dr. Telford Nab   TONSILLECTOMY       Social History:   reports that he has been smoking cigarettes. He has a 8.00 pack-year smoking history. He has never used smokeless tobacco. He reports current alcohol use of about 12.0 standard drinks of alcohol per week. He reports that he does not currently use drugs after having used the following drugs: Cocaine.   Family History:  His family history includes Cancer in his maternal uncle; Diabetes in his brother and mother; Other in his father. There is no history of Heart disease, Hypertension, or Stroke.   Allergies No Known Allergies   Home Medications  Prior to Admission medications   Medication Sig Start Date End Date Taking? Authorizing Provider  diazepam (VALIUM) 10 MG tablet Take 1 tablet (10 mg total) by mouth every 12 (twelve) hours as needed for anxiety. Caution - sedation 06/08/21   Tysinger, Camelia Eng, PA-C  famotidine (PEPCID) 40 MG tablet Take 40 mg by mouth daily. 07/22/21   [provider]  HYDROcodone-acetaminophen (NORCO) 10-325 MG tablet Take 0.5 tablets by mouth 2 (two) times daily as needed. Caution - sedation 06/20/21   Tysinger, Camelia Eng, PA-C  predniSONE (DELTASONE) 10 MG tablet 6 tablets all together day 1, 5 tablets day 2, 4 tablets day 3, 3 tablets day 4, 2 tablets day 5, 1 tablet day 6. Patient not taking: Reported on Aug 09, 2021 06/08/21   Tysinger, Camelia Eng, PA-C  sildenafil (VIAGRA) 100 MG tablet Take 1 tablet (100 mg total) by mouth as  needed for erectile dysfunction. 07/17/21   Tysinger, Camelia Eng, PA-C  tamsulosin (FLOMAX) 0.4 MG CAPS capsule Take 1 capsule (0.4 mg total) by mouth daily. 07/12/21   Tysinger, Camelia Eng, PA-C     Critical care time:  35 min

## 2021-07-28 NOTE — Progress Notes (Signed)
EEG complete - results pending 

## 2021-07-28 NOTE — ED Notes (Addendum)
Critical care at bedside - MD Ephriam Knuckles

## 2021-07-28 NOTE — Progress Notes (Signed)
Re-examined in ICU.  Remains on pressors.  No brainstem reflexes.  Family updated at length.  Continue vent support, pressors, abx x 24h and see where we stand.  Allow natural death should any further deterioration occur.  Myrla Halsted MD PCCM

## 2021-07-28 NOTE — Progress Notes (Signed)
RT discontinued ventilator per MD order. ET remained per Medical examiners protocol.

## 2021-07-28 NOTE — ED Notes (Signed)
Levophed titrate to 25

## 2021-07-28 NOTE — ED Notes (Signed)
Levo titrated to 40 per MD Horton

## 2021-07-28 NOTE — Progress Notes (Signed)
Ventilator patient transported from ED to 2H12 without any complications.

## 2021-07-28 NOTE — Progress Notes (Signed)
Pt transported from 2H12 to CT and back without any complications.

## 2021-07-28 DEATH — deceased

## 2021-08-28 NOTE — Death Summary Note (Signed)
DEATH SUMMARY   Patient Details  Name: Vincent Cook MRN: [id number] DOB: May 23, 1965  Admission/Discharge Information   Admit Date:  Aug 19, 2021  Date of Death: Date of Death: August 19, 2021  Time of Death: Time of Death: 05/31/2022  Length of Stay: 1  Referring Physician: Jac Canavan, PA-C   Reason(s) for Hospitalization  Out of hospital cardiac arrest Post arrest anoxic cerebral edema with herniation/brain compression Post arrest circulatory shock  Brief Hospital Course (including significant findings, care, treatment, and services provided and events leading to death)  56 year old man who did cocaine last night and was found pulseless.  > 30 mins CPR.  ROSC obtained in ER and patient intubated by EDP.  Unable to place OGT despite multiple atempts.  Ongoing severe shock state.  PCCM consulted for admission.  Hx per chart review.  Despite supportive care including mechanical ventilation, patient progressed to brain death during his short stay in ICU.  Pertinent Labs and Studies  Significant Diagnostic Studies ECHOCARDIOGRAM LIMITED  Result Date: August 19, 2021    ECHOCARDIOGRAM LIMITED REPORT   Patient Name:   Vincent Cook Date of Exam: 08/19/2021 Medical Rec #:  [id number]       Height:       72.0 in Accession #:    [id number]      Weight:       200.0 lb Date of Birth:  [birth year]      BSA:          2.131 m Patient Age:    56 years        BP:           117/87 mmHg Patient Gender: M               HR:           115 bpm. Exam Location:  Inpatient Procedure: Limited Echo, Cardiac Doppler, Color Doppler and Intracardiac            Opacification Agent Indications:    I49.8 Other specified cardiac arrhythmias. Cardiac arrest  History:        Patient has no prior history of Echocardiogram examinations.                 Abnormal ECG, Arrythmias:Cardiac Arrest; Risk Factors:Current                 Smoker.  Sonographer:    Sheralyn Boatman RDCS Referring Phys: [id number] Lorin Glass  Sonographer Comments:  Technically difficult study due to poor echo windows, echo performed with patient supine and on artificial respirator, suboptimal parasternal window and suboptimal apical window. Image acquisition challenging due to respiratory motion. IMPRESSIONS  1. Left ventricular ejection fraction, by estimation, is 45 to 50%. The left ventricle has mildly decreased function. The left ventricle demonstrates global hypokinesis. There is mild left ventricular hypertrophy.  2. Right ventricular systolic function is normal. The right ventricular size is normal.  3. The mitral valve is normal in structure. No evidence of mitral valve regurgitation. No evidence of mitral stenosis.  4. The aortic valve is normal in structure. Aortic valve regurgitation is not visualized. No aortic stenosis is present.  5. The inferior vena cava is normal in size with greater than 50% respiratory variability, suggesting right atrial pressure of 3 mmHg. FINDINGS  Left Ventricle: Left ventricular ejection fraction, by estimation, is 45 to 50%. The left ventricle has mildly decreased function. The left ventricle demonstrates global hypokinesis. Definity contrast agent was given IV to delineate the  left ventricular  endocardial borders. The left ventricular internal cavity size was normal in size. There is mild left ventricular hypertrophy. Right Ventricle: The right ventricular size is normal. No increase in right ventricular wall thickness. Right ventricular systolic function is normal. Left Atrium: Left atrial size was normal in size. Right Atrium: Right atrial size was normal in size. Pericardium: There is no evidence of pericardial effusion. Mitral Valve: The mitral valve is normal in structure. No evidence of mitral valve stenosis. Tricuspid Valve: The tricuspid valve is normal in structure. Tricuspid valve regurgitation is not demonstrated. No evidence of tricuspid stenosis. Aortic Valve: The aortic valve is normal in structure. Aortic valve  regurgitation is not visualized. No aortic stenosis is present. Pulmonic Valve: The pulmonic valve was normal in structure. Pulmonic valve regurgitation is not visualized. No evidence of pulmonic stenosis. Aorta: The aortic root is normal in size and structure. Venous: The inferior vena cava is normal in size with greater than 50% respiratory variability, suggesting right atrial pressure of 3 mmHg. IAS/Shunts: No atrial level shunt detected by color flow Doppler. LEFT VENTRICLE PLAX 2D LVIDd:         3.85 cm LVIDs:         3.20 cm LV PW:         1.15 cm LV IVS:        1.15 cm  LV Volumes (MOD) LV vol d, MOD A2C: 73.1 ml LV vol d, MOD A4C: 82.4 ml LV vol s, MOD A2C: 35.1 ml LV vol s, MOD A4C: 45.9 ml LV SV MOD A2C:     38.0 ml LV SV MOD A4C:     82.4 ml LV SV MOD BP:      36.6 ml LEFT ATRIUM         Index LA diam:    2.10 cm 0.99 cm/m   AORTA Ao Root diam: 3.70 cm Donato Schultz MD Electronically signed by Donato Schultz MD Signature Date/Time: 08-04-21/4:09:04 PM    Final    EEG adult  Result Date: 08-04-2021 Charlsie Quest, MD     Aug 04, 2021  1:54 PM Patient Name: Delwyn Scoggin MRN: [id number] Epilepsy Attending: Charlsie Quest Referring Physician/Provider: Elige Radon, MD Date: 2021-08-04 Duration: 22.15 mins Patient history: 56 year old man who did cocaine last night and was found pulseless.  > 30 mins CPR. EEG to evaluate for seizure. Level of alertness: comatose AEDs during EEG study: Propofol Technical aspects: This EEG study was done with scalp electrodes positioned according to the 10-20 International system of electrode placement. Electrical activity was acquired at a sampling rate of 500Hz  and reviewed with a high frequency filter of 70Hz  and a low frequency filter of 1Hz . EEG data were recorded continuously and digitally stored. Description: EEG showed continuous generalized background suppression, not reactive to tactile stimulation. Hyperventilation and photic stimulation were not performed.    ABNORMALITY -Background suppression, generalized IMPRESSION: This study is suggestive of profound diffuse encephalopathy, nonspecific etiology but likely related to sedation,anoxic/hypoxic brain injury. No seizures or epileptiform discharges were seen throughout the recording. Dr was notified. Priyanka   CT HEAD WO CONTRAST ( )  Result Date: 08-04-2021 CLINICAL DATA:  Mental status change, unknown cause. Additional history obtained from electronic MEDICAL RECORD NUMBERStatus post cardiac arrest and CPR. EXAM: CT HEAD WITHOUT CONTRAST TECHNIQUE: Contiguous axial images were obtained from the base of the skull through the vertex without intravenous contrast. RADIATION DOSE REDUCTION: This exam was performed according to the departmental  dose-optimization program which includes automated exposure control, adjustment of the mA and/or kV according to patient size and/or use of iterative reconstruction technique. COMPARISON:  No pertinent prior exams available for comparison. FINDINGS: Brain: Diffuse cerebral and cerebellar sulcal effacement with poor gray-white differentiation. Findings are compatible with acute hypoxic/ischemic injury with cerebral and cerebellar edema. Significant partial effacement of the basal cisterns. Posterior fossa mass effect is also present with partial effacement of the fourth ventricle. No cerebellar tonsillar herniation at this time. There is no acute intracranial hemorrhage. No extra-axial fluid collection. No evidence of an intracranial mass. No midline shift. Vascular: No focal hyperdense vessel. Atherosclerotic calcifications. Skull: No fracture or aggressive osseous lesion. Sinuses/Orbits: No mass or acute finding within the imaged orbits. Fluid level and frothy secretions within the right frontal sinus. Scattered mucosal thickening and fluid within the right greater than left ethmoid air cells. Minimal mucosal thickening within the bilateral sphenoid sinuses. Mild  mucosal thickening within the bilateral maxillary sinuses. Other: Periapical lucency surrounding the left maxillary first molar (with bony remodeling and encroachment upon the inferior aspect of the left maxillary sinus). These results will be called to the ordering clinician or representative by the Radiologist Assistant, and communication documented in the PACS or [employer]. IMPRESSION: Diffuse cerebral and cerebellar sulcal effacement with poor gray-white differentiation. Findings are compatible with acute hypoxic/ischemic injury with cerebral and cerebellar edema. There is significant partial effacement of the basal cisterns. Posterior fossa mass effect is also present with partial effacement of the fourth ventricle. Paranasal sinus disease, as described. Left maxillary sinus mucosal thickening may reflect odontogenic sinusitis as there is prominent periapical lucency surrounding the left maxillary first molar. Electronically Signed   By: Jackey Loge D.O.   On: 08-07-2021 13:16   DG Chest Portable 1 View  Addendum Date: 08/07/2021   ADDENDUM REPORT: August 07, 2021 07:38 ADDENDUM: Study discussed by telephone with Dr. Ross Marcus on 08-07-21 at 0735 hours. Electronically Signed   By: Odessa Fleming M.D.   On: Aug 07, 2021 07:38   Result Date: 07-Aug-2021 CLINICAL DATA:  56 year old male found unresponsive and pulseless status post CPR, intubation. EXAM: PORTABLE CHEST 1 VIEW COMPARISON:  Chest radiographs 04/12/2020 and earlier. FINDINGS: Portable AP supine view at 0715 hours. Endotracheal tube tip is above the clavicles, approximately 9 cm from the carina. No enteric tube identified. There is moderate to severe gaseous distension of the stomach in the left upper quadrant. Pacer or resuscitation pads project over the lower chest. Lower lung volumes. Mediastinal contours remain within normal limits. Allowing for portable technique the lungs are clear. No displaced rib fracture identified. IMPRESSION: 1.  Endotracheal tube tip above the clavicles. Recommend advancing the ET tube 4 cm with repeat portable chest to confirm. 2. Gas distended stomach in the left upper quadrant with no enteric tube visible. 3.  No acute cardiopulmonary abnormality identified. Electronically Signed: By: Odessa Fleming M.D. On: 08/07/21 07:29    Microbiology No results found for this or any previous visit (from the past 240 hour(s)).  Lab Basic Metabolic Panel: Recent Labs  Lab 07-Aug-2021 0701 08/07/21 1003 08-07-21 1502 Aug 07, 2021 1728 2021-08-07 1754 2021-08-07 1834  NA 142 136 136 135 136 138  K 4.4 4.3 2.9* 2.8* 2.6* 3.0*  CL 100  --   --   --   --  100  CO2 12*  --   --   --   --  18*  GLUCOSE 215*  --   --   --   --  187*  BUN 12  --   --   --   --  21*  CREATININE 1.73*  --   --   --   --  2.84*  CALCIUM 8.9  --   --   --   --  8.3*   Liver Function Tests: Recent Labs  Lab 15-Aug-2021 0701  AST 857*  ALT 793*  ALKPHOS 68  BILITOT 1.0  PROT 5.9*  ALBUMIN 3.0*   No results for input(s): "LIPASE", "AMYLASE" in the last 168 hours. No results for input(s): "AMMONIA" in the last 168 hours. CBC: Recent Labs  Lab August 15, 2021 0701 08-15-2021 1003 08/15/21 1502 August 15, 2021 1728 08-15-21 1754  WBC 17.3*  --   --   --   --   NEUTROABS 10.3*  --   --   --   --   HGB 14.6 17.7* 17.7* 17.7* 18.0*  HCT 47.0 52.0 52.0 52.0 53.0*  MCV 105.4*  --   --   --   --   PLT 166  --   --   --   --    Cardiac Enzymes: No results for input(s): "CKTOTAL", "CKMB", "CKMBINDEX", "TROPONINI" in the last 168 hours. Sepsis Labs: Recent Labs  Lab Aug 15, 2021 0701 Aug 15, 2021 1133 2021/08/15 1834  WBC 17.3*  --   --   LATICACIDVEN >9.0* >9.0* 9.0*    Lorin Glass 07/28/2021, 1:19 PM

## 2022-02-21 ENCOUNTER — Encounter: Payer: Managed Care, Other (non HMO) | Admitting: Medical

## 2024-01-08 IMAGING — CR DG LUMBAR SPINE COMPLETE 4+V
5 series · 5 of 5 positions shown · non-contrast
Comparison: Lumbar spine series 02/01/2009.

CLINICAL DATA: Chronic back pain radiating into right lower
extremity.

EXAM:
LUMBAR SPINE - COMPLETE 4+ VIEW

[w lumbar spine ap]
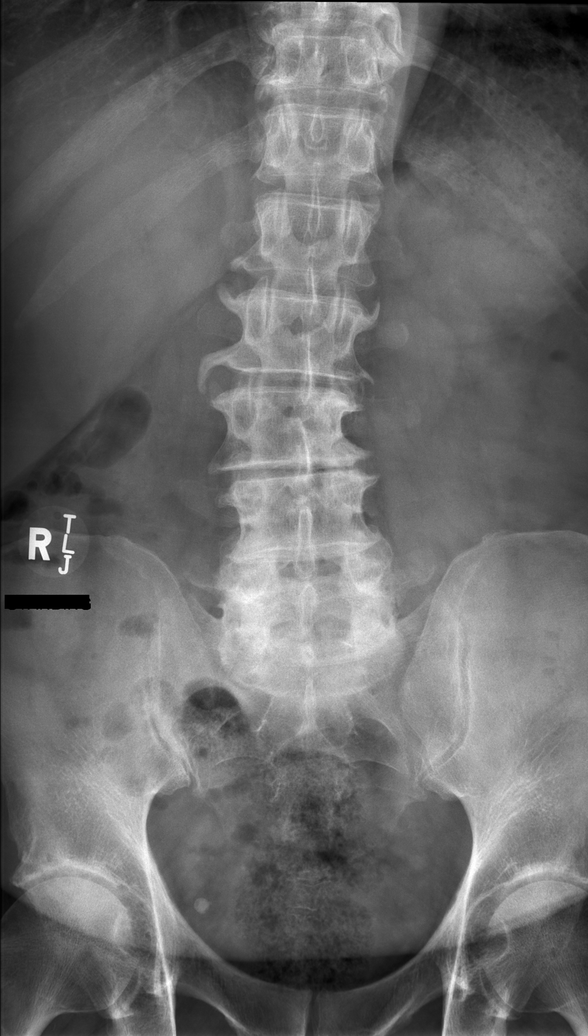

[w lumbar spine obl (1 of 2)]
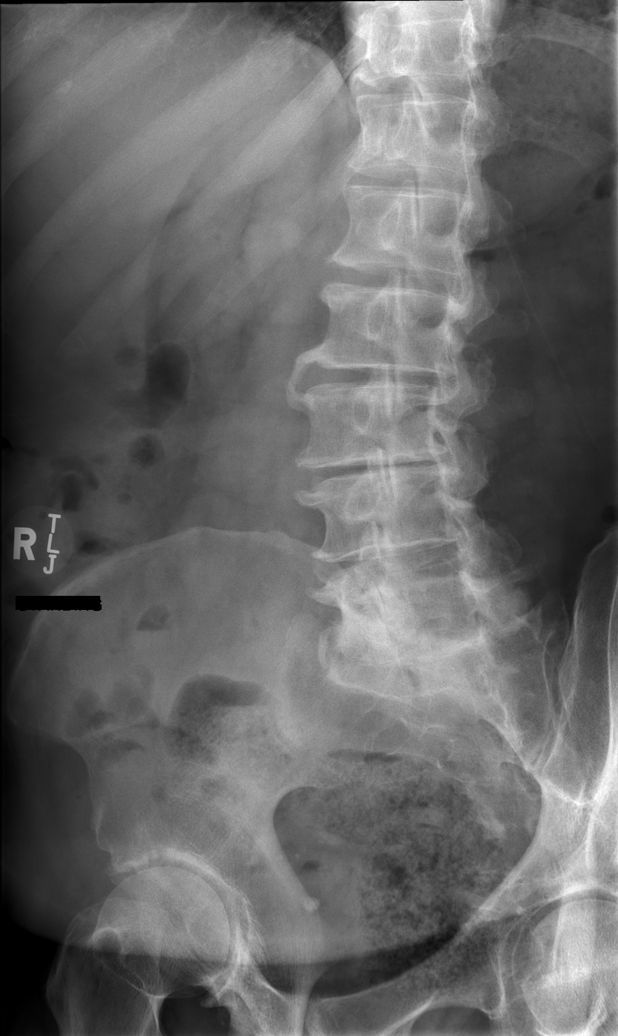

[w lumbar spine obl (2 of 2)]
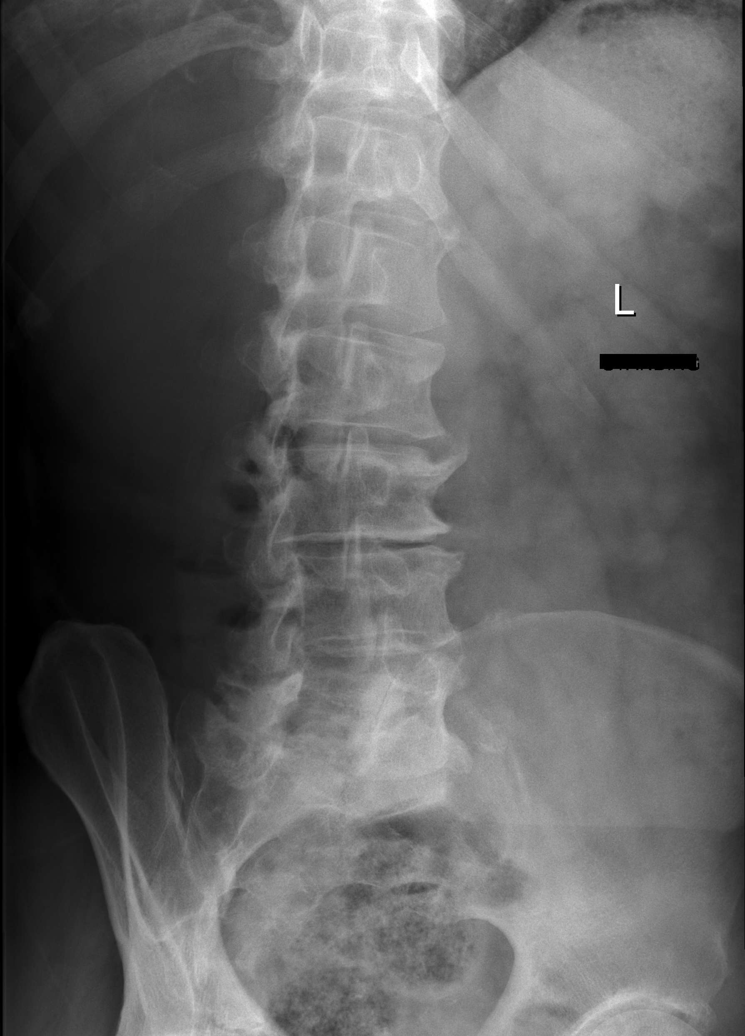

[w lumbar spine lat]
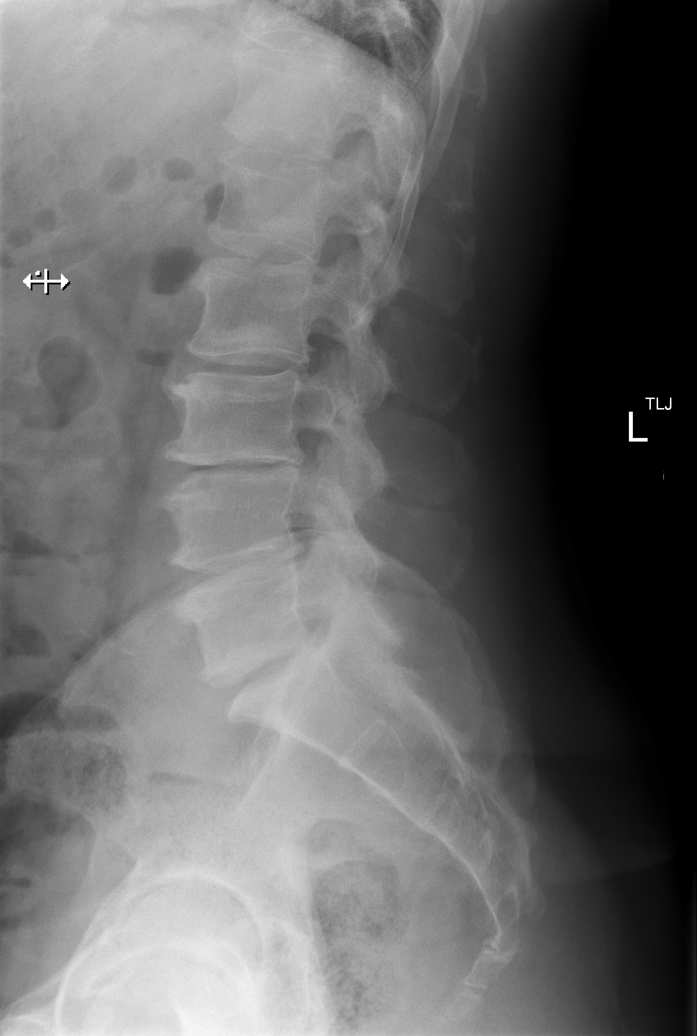

[w lumbar l-5 s-1 spot]
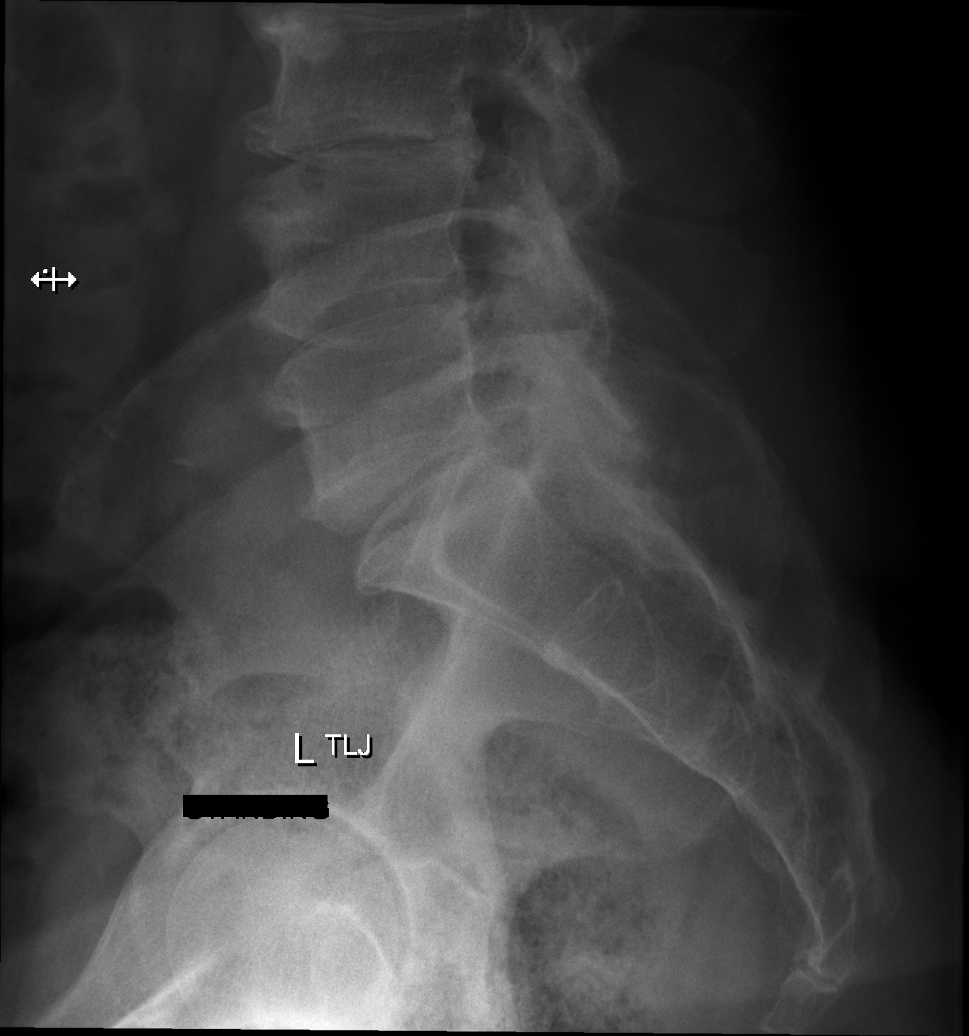

[5 of 5 positions shown; findings below may reference images not displayed]

FINDINGS: There is mild, increased lumbar dextrorotary scoliosis apex at L2-3,
interval development of a grade 1 likely discogenic retrolisthesis
at L2-3, and otherwise normal alignment.

There is increased lumbar marginal osteophytosis all levels but
greatest at L1-2, L2-3 and L3-4 with increasing degenerative disc
space loss at all levels which is moderate at L1-2, L2-3 and L4-5,
with collapse of the discs and vacuum phenomenon seen at L3-4 and
L5-S1.

There is also increased facet hypertrophy progressively from L2-3
down, at L4-5 and L5-S1 which appears to at least moderately
encroach on the bilateral neural foramina more so on the left.

The bone mineralization is normal. The vertebra are normal in
heights with no evidence of fractures.

The SI joints are patent.  Visualized bony pelvis is intact.
IMPRESSION: Since 5111, there is increased scoliosis and multilevel degenerative
change of the lumbar spine and development of grade 1 L2-3
retrolisthesis. The foramina appear increasingly stenotic at the
lowest 2 levels. Remaining findings described above.
